# Patient Record
Sex: Female | Born: 1985 | Race: White | Hispanic: No | Marital: Married | Smoking: Never smoker
Health system: Southern US, Community
[De-identification: ages and names within clinical notes are randomized; demographics above are authoritative.]

## PROBLEM LIST (undated history)

## (undated) ENCOUNTER — Inpatient Hospital Stay (HOSPITAL_COMMUNITY): Payer: Self-pay

## (undated) DIAGNOSIS — Z973 Presence of spectacles and contact lenses: Secondary | ICD-10-CM

## (undated) DIAGNOSIS — G43909 Migraine, unspecified, not intractable, without status migrainosus: Secondary | ICD-10-CM

## (undated) DIAGNOSIS — K219 Gastro-esophageal reflux disease without esophagitis: Secondary | ICD-10-CM

## (undated) DIAGNOSIS — J4520 Mild intermittent asthma, uncomplicated: Secondary | ICD-10-CM

## (undated) HISTORY — PX: WISDOM TOOTH EXTRACTION: SHX21

---

## 2005-12-16 HISTORY — PX: TONSILLECTOMY: SUR1361

## 2008-05-06 ENCOUNTER — Emergency Department (HOSPITAL_COMMUNITY): Admission: EM | Admit: 2008-05-06 | Discharge: 2008-05-06 | Payer: Self-pay | Admitting: Emergency Medicine

## 2009-10-26 ENCOUNTER — Ambulatory Visit (HOSPITAL_COMMUNITY): Admission: RE | Admit: 2009-10-26 | Discharge: 2009-10-26 | Payer: Self-pay | Admitting: Gastroenterology

## 2009-10-26 IMAGING — US US ABDOMEN COMPLETE
1 series · 14 of 25 positions shown · non-contrast
Comparison: None.

CLINICAL DATA: Abdominal pain, nausea

ABDOMINAL ULTRASOUND COMPLETE

[Series 1: us abdomen complete · 0.22mm/px · 14 of 61 slices shown]
[im 1/61]
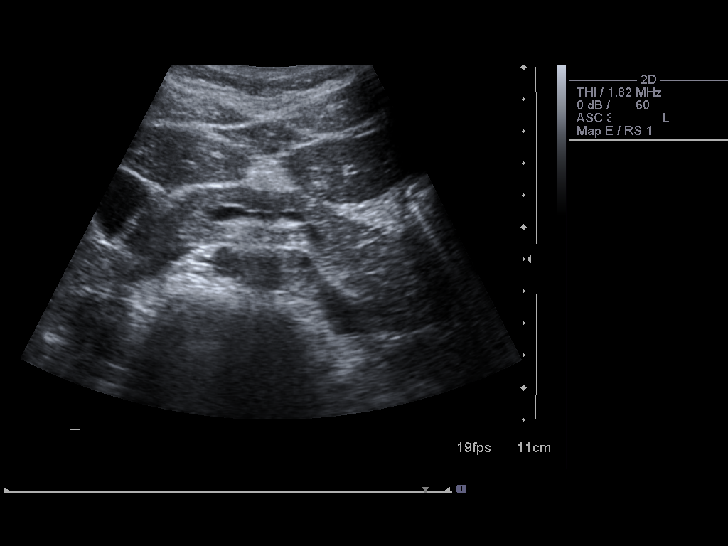
[im 6/61]
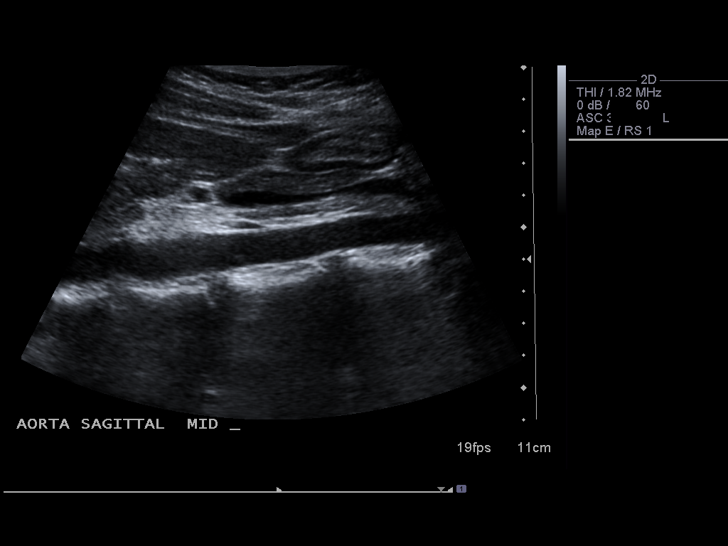
[im 11/61]
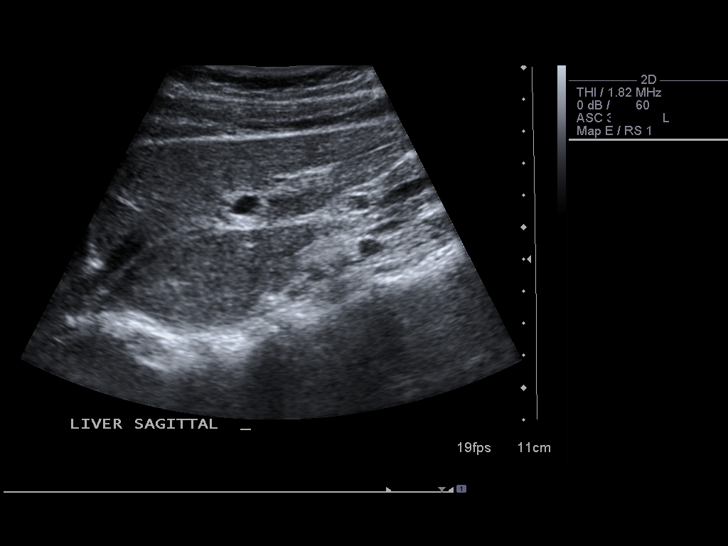
[im 16/61]
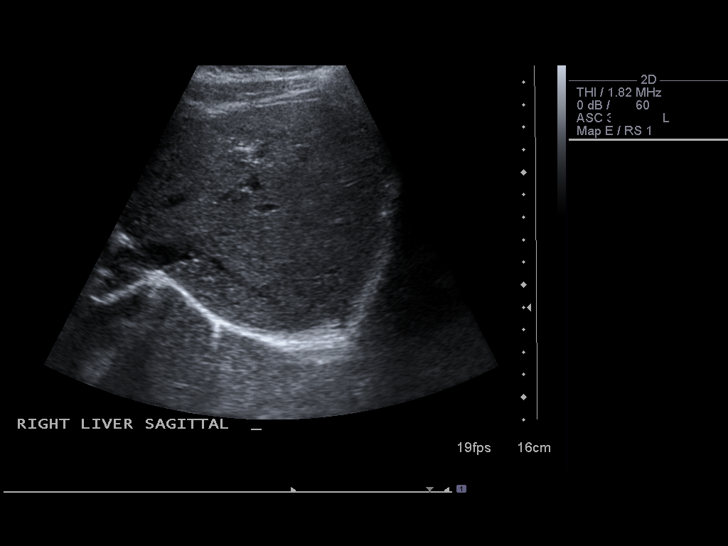
[im 21/61]
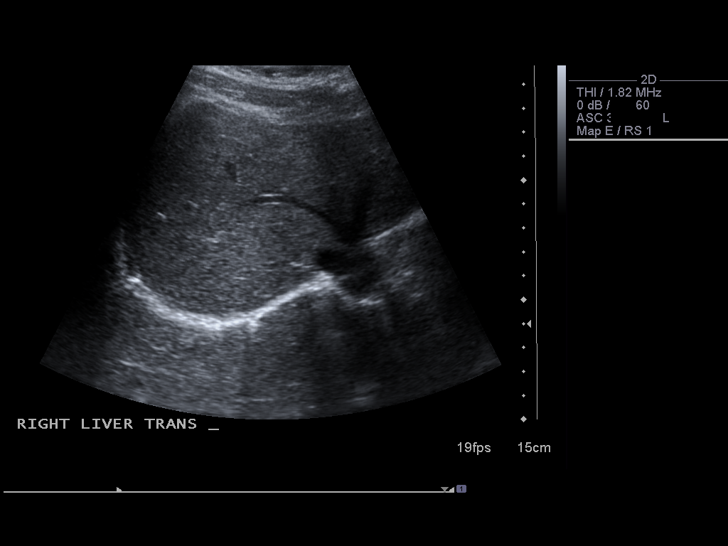
[im 23/61]
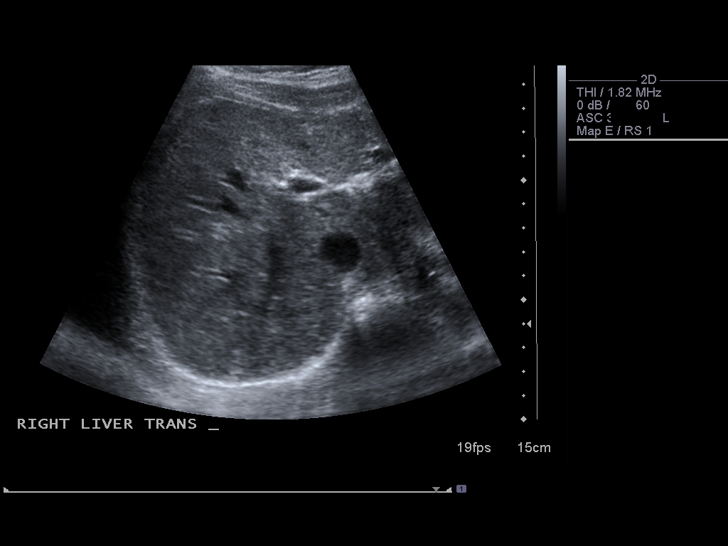
[im 28/61]
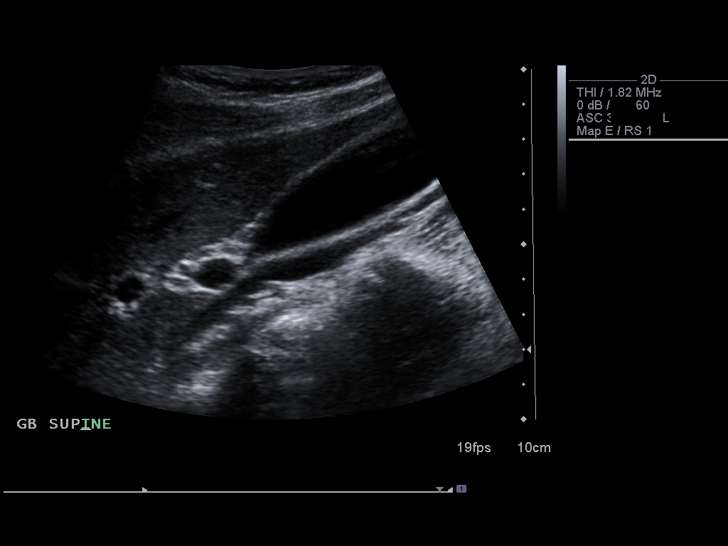
[im 33/61]
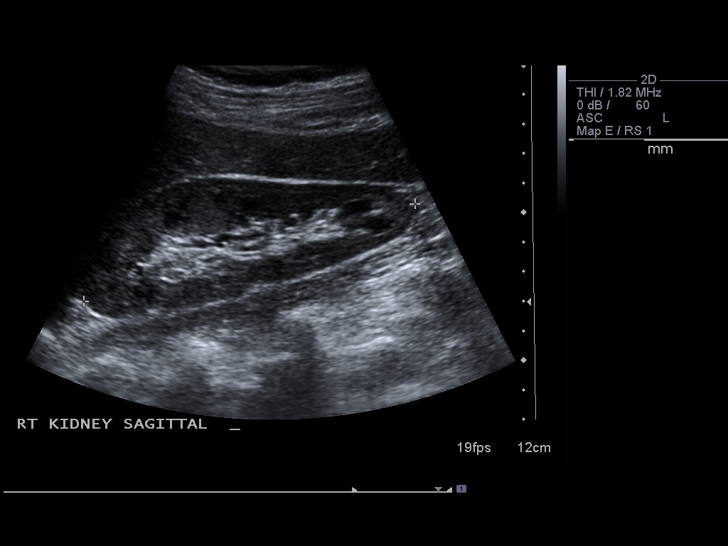
[im 38/61]
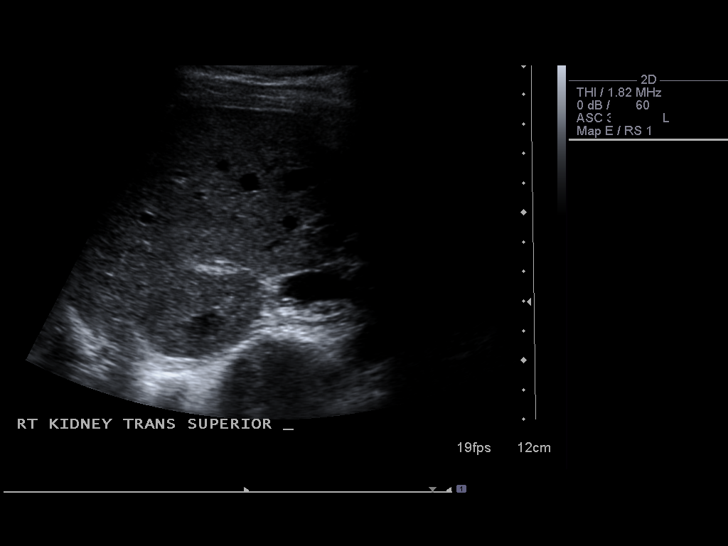
[im 41/61]
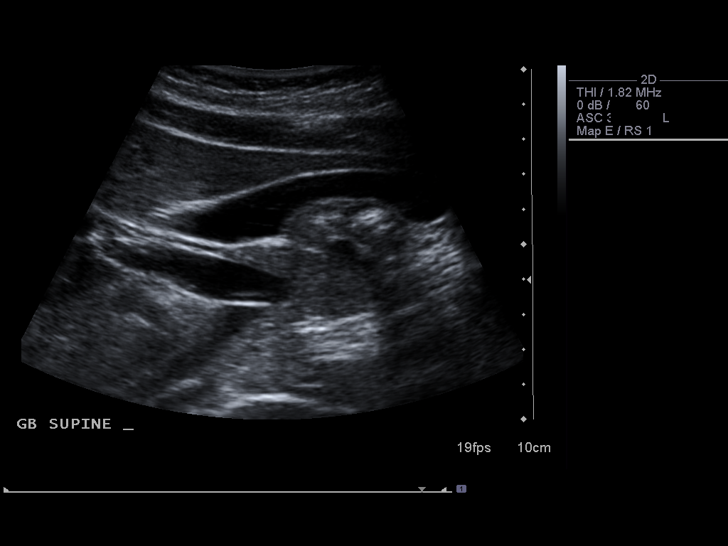
[im 46/61]
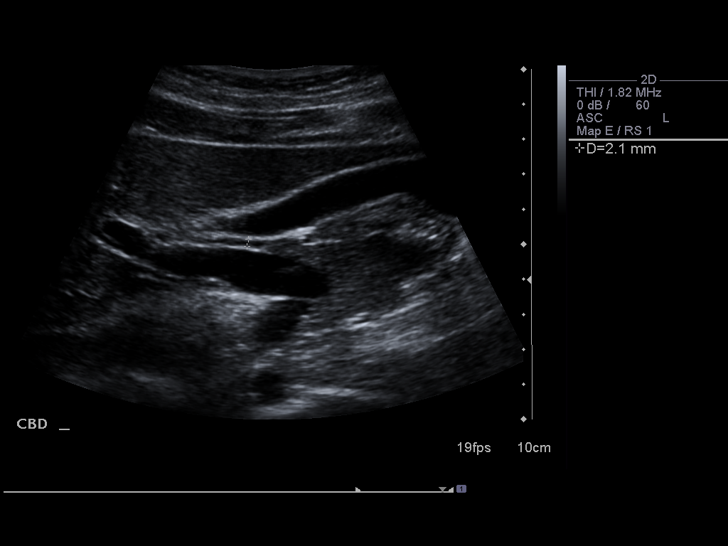
[im 51/61]
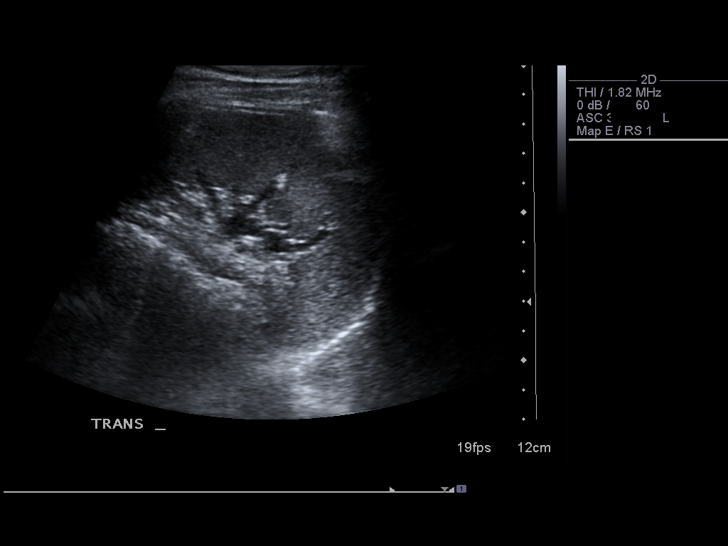
[im 56/61]
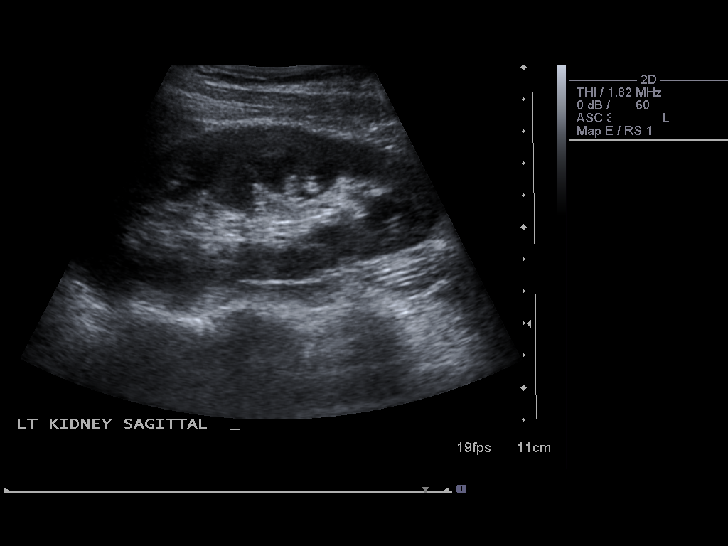
[im 61/61]
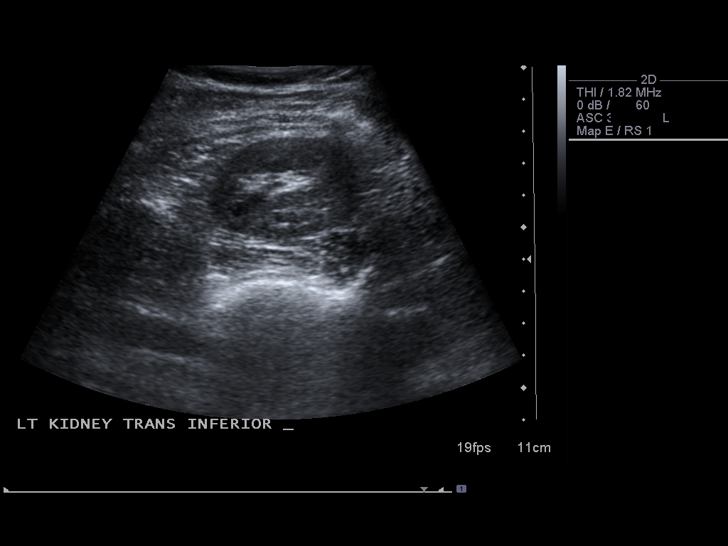

[14 of 25 positions shown; findings below may reference images not displayed]

FINDINGS: Gallbladder:  No gallstones, gallbladder wall thickening, or
pericholecystic fluid.

Common Bile Duct:  Within normal limits in caliber.

Liver:  No focal lesion identified.  Within normal limits in
parenchymal echogenicity.

IVC:  Appears normal.

Pancreas:  Although the pancreas is difficult to visualize in its
entirety, no focal pancreatic abnormality is identified.

Spleen:  Within normal limits in size and echotexture.

Right kidney:  Normal in size and parenchymal echogenicity.  No
evidence of mass or hydronephrosis.

Left kidney:  Normal in size and parenchymal echogenicity.  No
evidence of mass or hydronephrosis.

Abdominal Aorta:  No aneurysm identified.
IMPRESSION: Negative abdominal ultrasound.

## 2009-10-26 IMAGING — NM NM HEPATO W/GB/PHARM/[PERSON_NAME]
2 series · 12 of 12 positions shown · non-contrast
Comparison: Ultrasound 10/26/2009

CLINICAL DATA: Abdominal pain and nausea

NUCLEAR MEDICINE HEPATOBILIARY IMAGING WITH GALLBLADDER EF
TECHNIQUE: Sequential images of the abdomen were obtained [DATE]
minutes following intravenous administration of
radiopharmaceutical. After slow intravenous infusion of 1.05 uCg
Cholecystokinin, gallbladder ejection fraction was determined.
Radiopharmaceutical: 4.8 mCi Dc-CCm Choletec

[he hepatobiliary · 3.43mm/px · 6 of 30 frames shown (1 of 2)]
[frame 3/30]
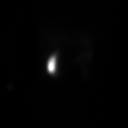
[frame 8/30]
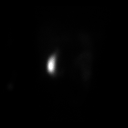
[frame 13/30]
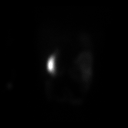
[frame 18/30]
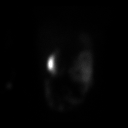
[frame 23/30]
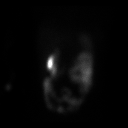
[frame 28/30]
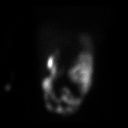

[he hepatobiliary · 3.43mm/px · 6 of 60 frames shown (2 of 2)]
[frame 6/60]
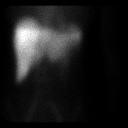
[frame 16/60]
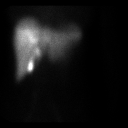
[frame 26/60]
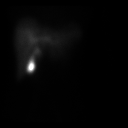
[frame 36/60]
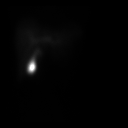
[frame 46/60]
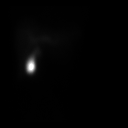
[frame 56/60]
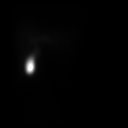

[12 of 12 positions shown; findings below may reference images not displayed]

FINDINGS: There is prompt visualization of hepatic activity. There
is prompt visualization of the common bile duct. Subsequently
intestinal activity was identified.

The gallbladder began to visualize at 13 minutes.

During CCK infusion the gallbladder contracted 80%. 30% or greater
is normal range.

During CCK infusion the patient reported experiencing no symptoms.
IMPRESSION: There is demonstration of patency of the common bile duct and the
cystic duct. There is no evidence of cholecystitis.

During CCK infusion the gallbladder contracted 80%. 30% or greater
is normal range.

During CCK infusion the patient reported experiencing no symptoms.

## 2010-12-13 ENCOUNTER — Encounter
Admission: RE | Admit: 2010-12-13 | Discharge: 2010-12-13 | Payer: Self-pay | Source: Home / Self Care | Attending: General Practice | Admitting: General Practice

## 2010-12-13 IMAGING — CT CT HEAD W/O CM
2 series · 16 of 30 positions shown, 20 images · non-contrast
Comparison: None.

CLINICAL DATA: Increasing migraine headaches for 1 week with
dizziness.

CT HEAD WITHOUT CONTRAST
TECHNIQUE: Contiguous axial images were obtained from the base of
the skull through the vertex without contrast.

[Series 2: head w/o · axial · non-contrast · 0.49mm/px · z∈[+56,+179]mm · 13 of 28 slices shown, 17 images]
[im 2/28  brain]
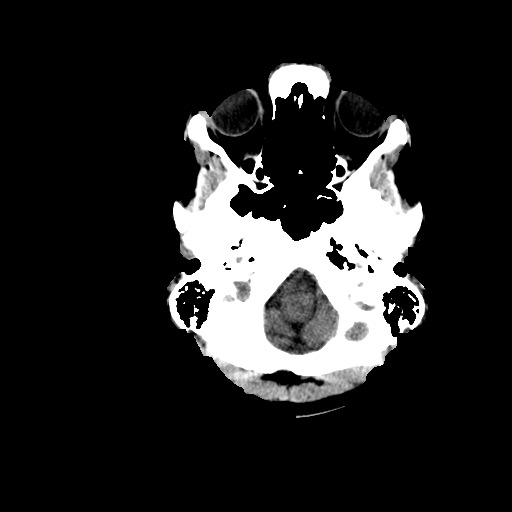
[im 2/28  bone]
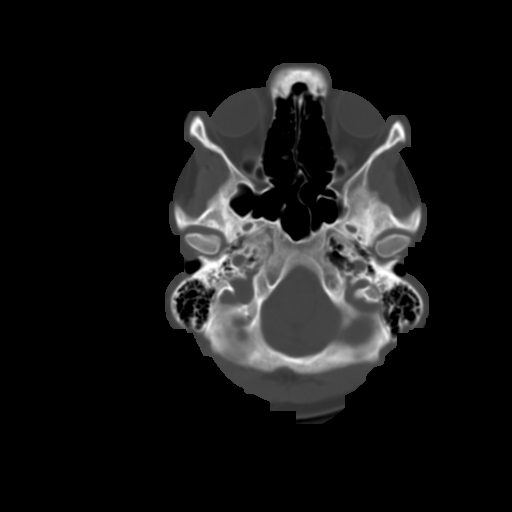
[im 4/28  brain]
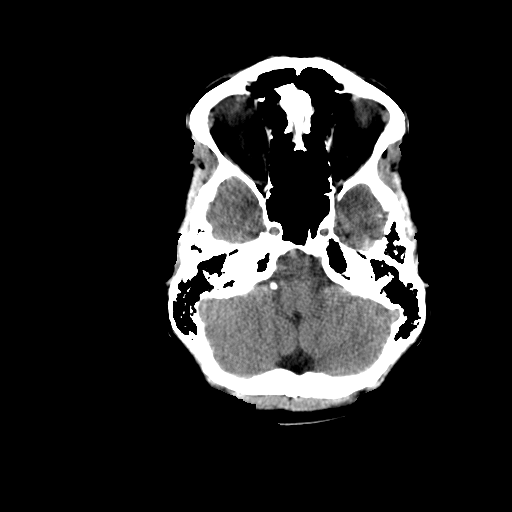
[im 6/28  brain]
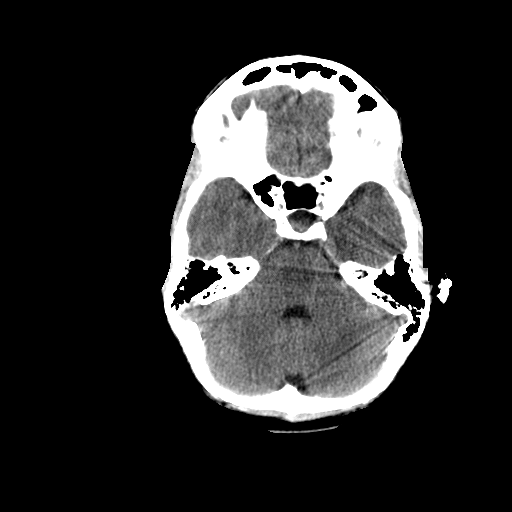
[im 8/28  brain]
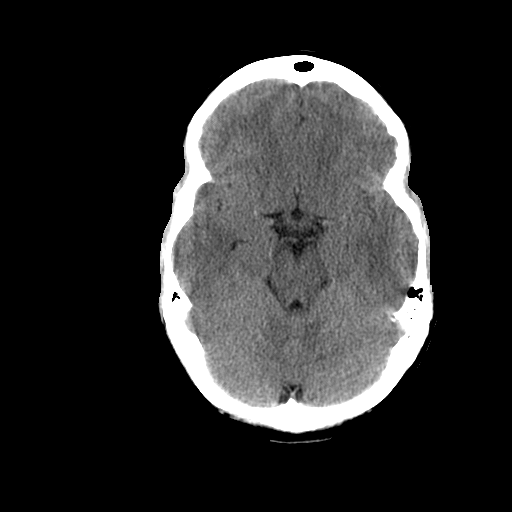
[im 10/28  brain]
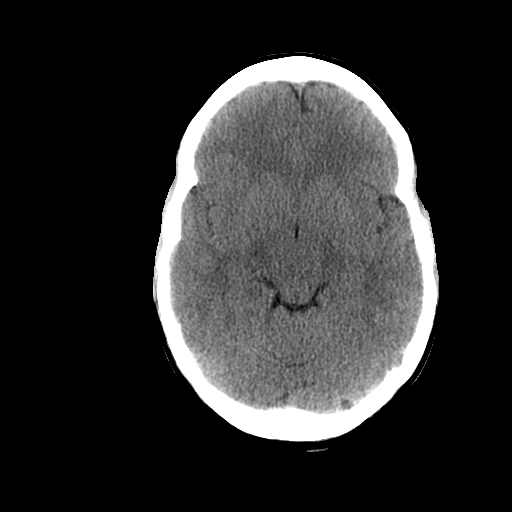
[im 10/28  bone]
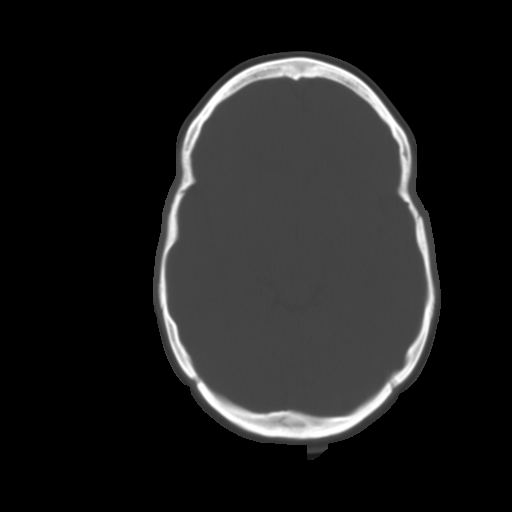
[im 12/28  brain]
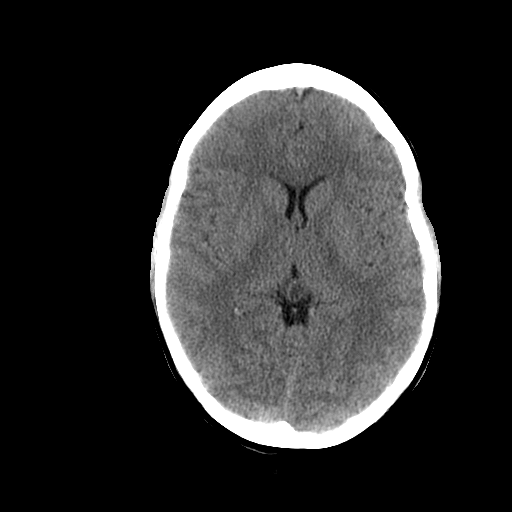
[im 14/28  brain]
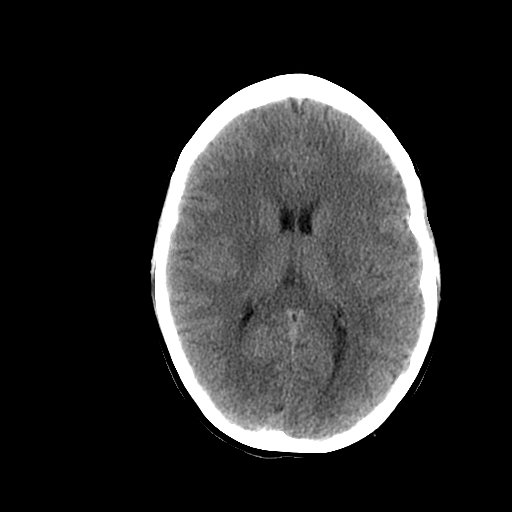
[im 16/28  brain]
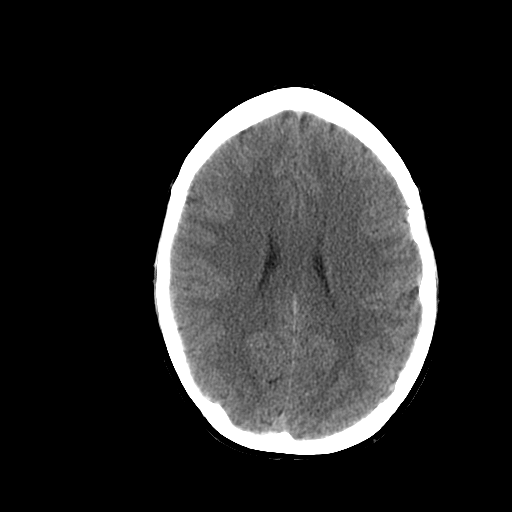
[im 18/28  brain]
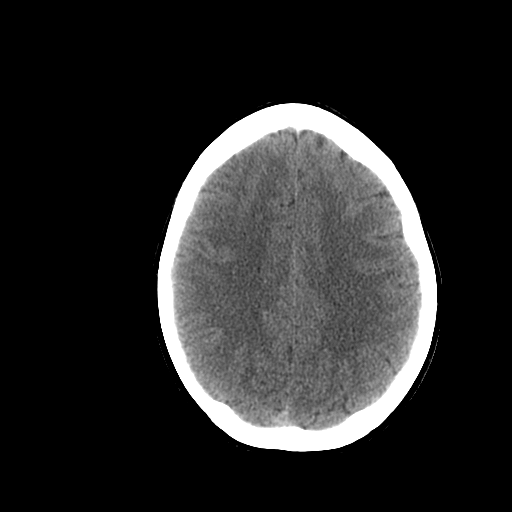
[im 18/28  bone]
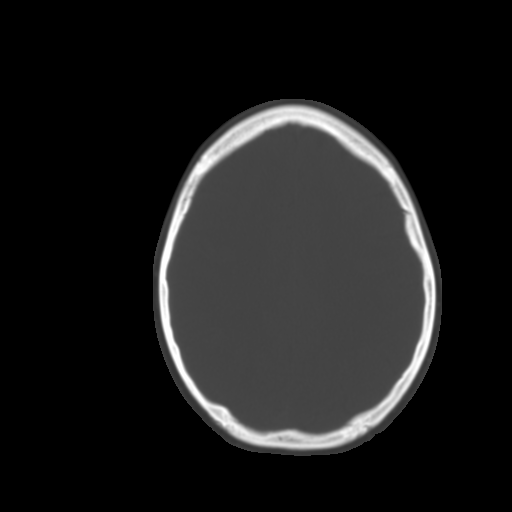
[im 20/28  brain]
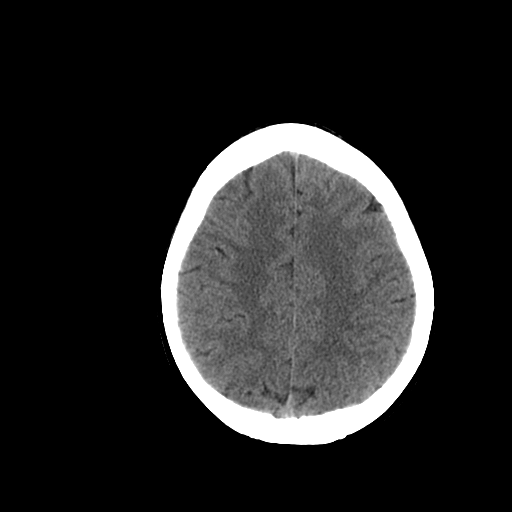
[im 22/28  brain]
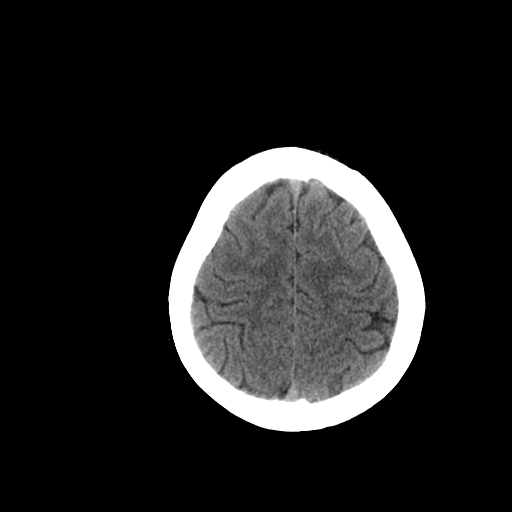
[im 24/28  brain]
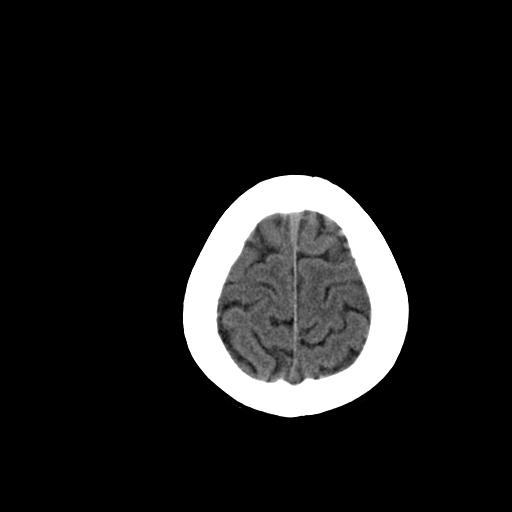
[im 26/28  brain]
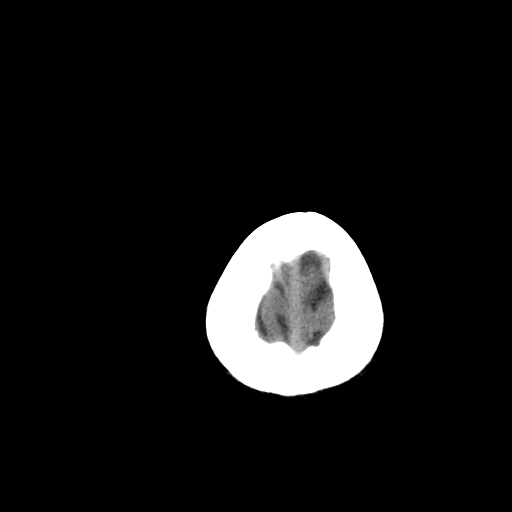
[im 26/28  bone]
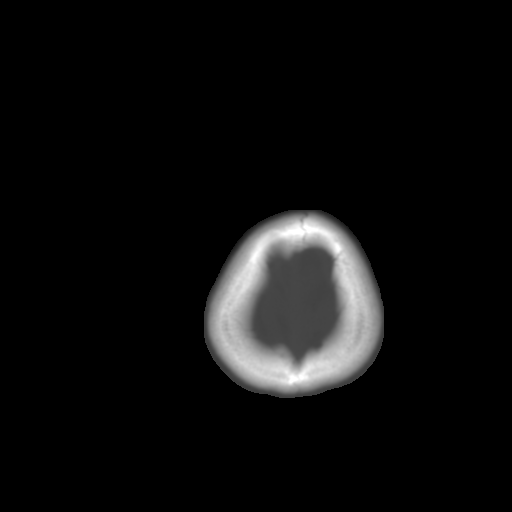

[Series 3: head bone · axial · 0.49mm/px · z∈[+56,+97]mm · 3 of 28 slices shown]
[im 2/28  bone]
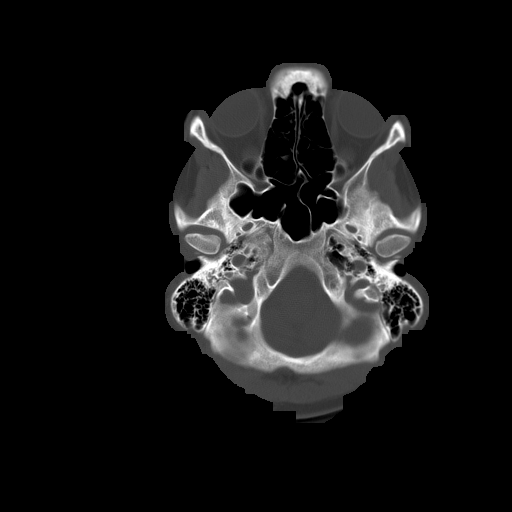
[im 6/28  bone]
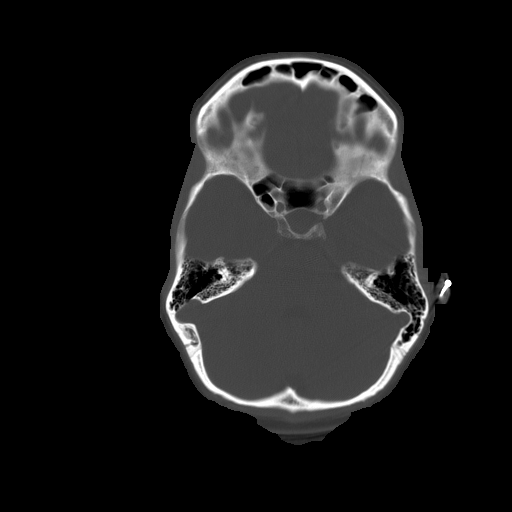
[im 10/28  bone]
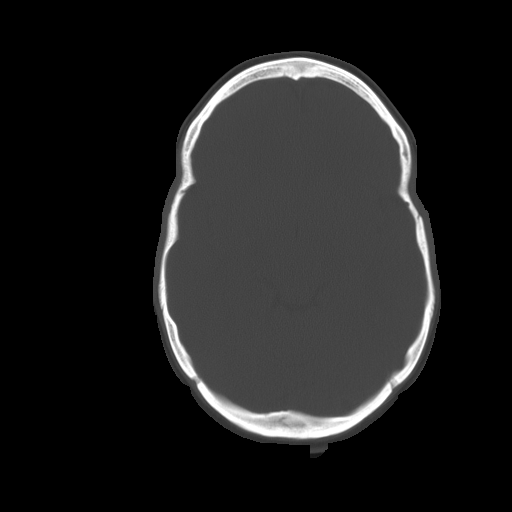

[16 of 30 positions shown; findings below may reference images not displayed]

FINDINGS: There is no evidence of acute intracranial hemorrhage,
mass lesion, brain edema or extra-axial fluid collection.  The
ventricles and subarachnoid spaces are appropriately sized for age.
There is no CT evidence of acute cortical infarction.

The visualized paranasal sinuses are clear. The calvarium is
intact.
IMPRESSION: Unremarkable noncontrast head CT.

## 2011-09-09 ENCOUNTER — Inpatient Hospital Stay (INDEPENDENT_AMBULATORY_CARE_PROVIDER_SITE_OTHER)
Admission: RE | Admit: 2011-09-09 | Discharge: 2011-09-09 | Disposition: A | Discharge status: Home / Self Care | Payer: BC Managed Care – PPO | Source: Ambulatory Visit | Attending: Family Medicine | Admitting: Family Medicine

## 2011-09-09 ENCOUNTER — Encounter: Payer: Self-pay | Admitting: Family Medicine

## 2011-09-09 DIAGNOSIS — J45909 Unspecified asthma, uncomplicated: Secondary | ICD-10-CM

## 2011-09-09 DIAGNOSIS — J301 Allergic rhinitis due to pollen: Secondary | ICD-10-CM

## 2011-09-09 DIAGNOSIS — J069 Acute upper respiratory infection, unspecified: Secondary | ICD-10-CM

## 2011-09-09 DIAGNOSIS — M940 Chondrocostal junction syndrome [Tietze]: Secondary | ICD-10-CM

## 2011-09-11 ENCOUNTER — Telehealth (INDEPENDENT_AMBULATORY_CARE_PROVIDER_SITE_OTHER): Payer: Self-pay | Admitting: Emergency Medicine

## 2011-10-23 ENCOUNTER — Emergency Department
Admission: EM | Admit: 2011-10-23 | Discharge: 2011-10-23 | Disposition: A | Discharge status: Home / Self Care | Payer: BC Managed Care – PPO | Source: Home / Self Care | Attending: Emergency Medicine | Admitting: Emergency Medicine

## 2011-10-23 DIAGNOSIS — R300 Dysuria: Secondary | ICD-10-CM

## 2011-10-23 MED ORDER — CIPROFLOXACIN HCL 500 MG PO TABS: 500.0000 mg | ORAL_TABLET | Freq: Two times a day (BID) | ORAL | Status: AC

## 2011-10-23 NOTE — ED Provider Notes (Signed)
History     CSN: [id number] Arrival date & time: No admission date for patient encounter.   First MD Initiated Contact with Patient 10/23/11 1809      No chief complaint on file.   (Consider location/radiation/quality/duration/timing/severity/associated sxs/prior treatment) HPI This patient complains of UTI symptoms for a few days.  They describe the pain as burning.  They have not used any OTC meds.  She has had a UTI in the past and it felt just like this. + frequency No urgency No hematuria No vaginal discharge No fever/chills No lower abdominal pain No back pain (more than baseline) No fatigue    No past medical history on file.  No past surgical history on file.  No family history on file.  History  Substance Use Topics  . Smoking status: Not on file  . Smokeless tobacco: Not on file  . Alcohol Use: Not on file    OB History    No data available      Review of Systems  Allergies  Review of patient's allergies indicates not on file.  Home Medications  No current outpatient prescriptions on file.  There were no vitals taken for this visit.  Physical Exam  Nursing note and vitals reviewed. Constitutional: She is oriented to person, place, and time. She appears well-developed and well-nourished.  HENT:  Head: Normocephalic and atraumatic.  Neck: Neck supple.  Cardiovascular: Regular rhythm and normal heart sounds.   Pulmonary/Chest: Effort normal and breath sounds normal. No respiratory distress.  Abdominal: Soft. Normal appearance and bowel sounds are normal. She exhibits no mass. There is tenderness in the suprapubic area. There is no rebound, no guarding, no CVA tenderness, no tenderness at McBurney's point and negative Murphy's sign.  Neurological: She is alert and oriented to person, place, and time.  Skin: Skin is warm and dry.  Psychiatric: She has a normal mood and affect. Her speech is normal.    ED Course  Procedures (including critical  care time)  Labs Reviewed - No data to display No results found.   No diagnosis found.    MDM        Lily Kocher, MD 10/23/11 [phone]

## 2011-10-24 LAB — POCT URINALYSIS DIPSTICK

## 2011-10-24 LAB — URINE CULTURE
Colony Count: NO GROWTH
Organism ID, Bacteria: NO GROWTH

## 2011-10-25 ENCOUNTER — Telehealth: Payer: Self-pay | Admitting: *Deleted

## 2011-11-18 NOTE — Progress Notes (Signed)
Summary: SINUSES (rm 4)   Vital Signs:  Patient Profile:   25 Years Old Female CC:      sinus pain, congestion x 3 days Height:     66.5 inches Weight:      137 pounds O2 Sat:      99 % O2 treatment:    Room Air Temp:     98.4 degrees F oral Pulse rate:   82 / minute Resp:     16 per minute BP sitting:   104 / 71  (left arm) Cuff size:   regular  Vitals Entered By: Lajean Saver RN (September 09, 2011 11:29 AM)                  Updated Prior Medication List: NUVARING 0.12-0.015 MG/24HR RING (ETONOGESTREL-ETHINYL ESTRADIOL)  ALBUTEROL SULFATE (2.5 MG/3ML) 0.083% NEBU (ALBUTEROL SULFATE) prn OMEPRAZOLE 20 MG CPDR (OMEPRAZOLE)  EMETROL 1.87-1.87-21.5 SOLN (FRUCTOSE-DEXTROSE-PHOSPHOR ACD)   Current Allergies: * ENVIRONMENTALHistory of Present Illness Chief Complaint: sinus pain, congestion x 3 days History of Present Illness:  Subjective: Patient complains of 3 day history of sinus congestion followed by mild sore throat. She has a history of frequent sinusitis.  She also has a history of mild asthma and occasionally uses an albuterol inhaler + mild cough today. No pleuritic pain No wheezing + post-nasal drainage + sinus pain/pressure No itchy/red eyes ? earache No hemoptysis No SOB No fever/chills No nausea No vomiting No abdominal pain No diarrhea No skin rashes + fatigue +  myalgias No headache Used OTC meds without relief   REVIEW OF SYSTEMS Constitutional Symptoms      Denies fever, chills, night sweats, weight loss, weight gain, and fatigue.  Eyes       Denies change in vision, eye pain, eye discharge, glasses, contact lenses, and eye surgery. Ear/Nose/Throat/Mouth       Complains of frequent runny nose and sinus problems.      Denies hearing loss/aids, change in hearing, ear pain, ear discharge, dizziness, frequent nose bleeds, sore throat, hoarseness, and tooth pain or bleeding.      Comments: congsetion Respiratory       Denies dry cough,  productive cough, wheezing, shortness of breath, asthma, bronchitis, and emphysema/COPD.  Cardiovascular       Denies murmurs, chest pain, and tires easily with exhertion.    Gastrointestinal       Denies stomach pain, nausea/vomiting, diarrhea, constipation, blood in bowel movements, and indigestion. Genitourniary       Denies painful urination, blood or discharge from vagina, kidney stones, and loss of urinary control. Neurological       Complains of headaches.      Denies paralysis, seizures, and fainting/blackouts. Musculoskeletal       Denies muscle pain, joint pain, joint stiffness, decreased range of motion, redness, swelling, muscle weakness, and gout.  Skin       Denies bruising, unusual mles/lumps or sores, and hair/skin or nail changes.  Psych       Denies mood changes, temper/anger issues, anxiety/stress, speech problems, depression, and sleep problems. Other Comments: taken sudafed OTC   Past History:  Past Medical History: migraines reflux asthma  Past Surgical History: Tonsillectomy  Social History: Never Smoked Alcohol use-no Drug use-no Smoking Status:  never Drug Use:  no   Objective:  Appearance:  Patient appears healthy, stated age, and in no acute distress  Eyes:  Pupils are equal, round, and reactive to light and accomodation.  Extraocular movement is intact.  Conjunctivae are not inflamed.  Ears:  Canals normal.  Tympanic membranes normal.   Nose:  Mildly congested turbinates.  No sinus tenderness  Pharynx:  Mildly erythematous Neck:  Supple.  Slightly tender shotty posterior nodes are palpated bilaterally.  Lungs:  Clear to auscultation.  Breath sounds are equal.  Chest:  Distinct tenderness over sternum Heart:  Regular rate and rhythm without murmurs, rubs, or gallops.  Abdomen:  Nontender without masses or hepatosplenomegaly.  Bowel sounds are present.  No CVA or flank tenderness.  Skin:  No rash Assessment New Problems: HAY FEVER  (ICD-477.0) ASTHMA, MILD (ICD-493.90) COSTOCHONDRITIS, ACUTE (ICD-733.6) UPPER RESPIRATORY INFECTION, ACUTE (ICD-465.9)   Plan New Medications/Changes: BENZONATATE 200 MG CAPS (BENZONATATE) One by mouth hs as needed cough  #12 x 0, 09/09/2011, Donna Christen MD AMOXICILLIN 875 MG TABS (AMOXICILLIN) One by mouth two times a day  #20 x 0, 09/09/2011, Donna Christen MD  New Orders: Pulse Oximetry (single measurment) [21308] New Patient Level IV [65784] Planning Comments:   With a history of sinusitis, will begin amoxicillin, expectorant/decongestant, topical decongestant,  cough suppressant at bedtime.  Continue albuterol inhaler.  May take ibuprofen for chest discomfort.  Increase fluid intake Followup with PCP if not improving 7 to 10 days   The patient and/or caregiver has been counseled thoroughly with regard to medications prescribed including dosage, schedule, interactions, rationale for use, and possible side effects and they verbalize understanding.  Diagnoses and expected course of recovery discussed and will return if not improved as expected or if the condition worsens. Patient and/or caregiver verbalized understanding.  Prescriptions: BENZONATATE 200 MG CAPS (BENZONATATE) One by mouth hs as needed cough  #12 x 0   Entered and Authorized by:   Donna Christen MD   Signed by:   Donna Christen MD on 09/09/2011   Method used:   Print then Give to Patient   RxID:   [id number] AMOXICILLIN 875 MG TABS (AMOXICILLIN) One by mouth two times a day  #20 x 0   Entered and Authorized by:   Donna Christen MD   Signed by:   Donna Christen MD on 09/09/2011   Method used:   Print then Give to Patient   RxID:   [id number]   Patient Instructions: 1)  Take Mucinex D (guaifenesin with decongestant) twice daily for congestion. 2)  Increase fluid intake, rest. 3)  May use Afrin nasal spray (or generic oxymetazoline) twice daily for about 5 days.  Also recommend using saline nasal spray  several times daily and/or saline nasal irrigation. 4)  Continue albuterol inhaler. 5)  May take Ibuprofen 200mg , 4 tabs every 8 hours with food if necessary for sternum pain 6)  Followup with family doctor if not improving 7 to 10 days.   Orders Added: 1)  Pulse Oximetry (single measurment) [94760] 2)  New Patient Level IV [59563]

## 2011-11-18 NOTE — Telephone Encounter (Signed)
  Phone Note Outgoing Call   Call placed by: Lavell Islam RN,  September 11, 2011 2:54 PM Call placed to: Patient Action Taken: Phone Call Completed Summary of Call: Message left on voice mail inquiring about patient's status and encouraging her to call with questions/concerns.

## 2012-07-02 ENCOUNTER — Emergency Department (INDEPENDENT_AMBULATORY_CARE_PROVIDER_SITE_OTHER): Payer: BC Managed Care – PPO

## 2012-07-02 ENCOUNTER — Emergency Department
Admission: EM | Admit: 2012-07-02 | Discharge: 2012-07-02 | Disposition: A | Discharge status: Home Health | Payer: BC Managed Care – PPO | Source: Home / Self Care

## 2012-07-02 DIAGNOSIS — J45909 Unspecified asthma, uncomplicated: Secondary | ICD-10-CM

## 2012-07-02 DIAGNOSIS — J45901 Unspecified asthma with (acute) exacerbation: Secondary | ICD-10-CM

## 2012-07-02 DIAGNOSIS — J069 Acute upper respiratory infection, unspecified: Secondary | ICD-10-CM

## 2012-07-02 DIAGNOSIS — R05 Cough: Secondary | ICD-10-CM

## 2012-07-02 IMAGING — CR DG CHEST 2V
2 series · 2 of 2 positions shown · non-contrast
Comparison: None.

CLINICAL DATA: Cough.  Asthma.

CHEST - 2 VIEW

[view not recorded (1 of 2)]
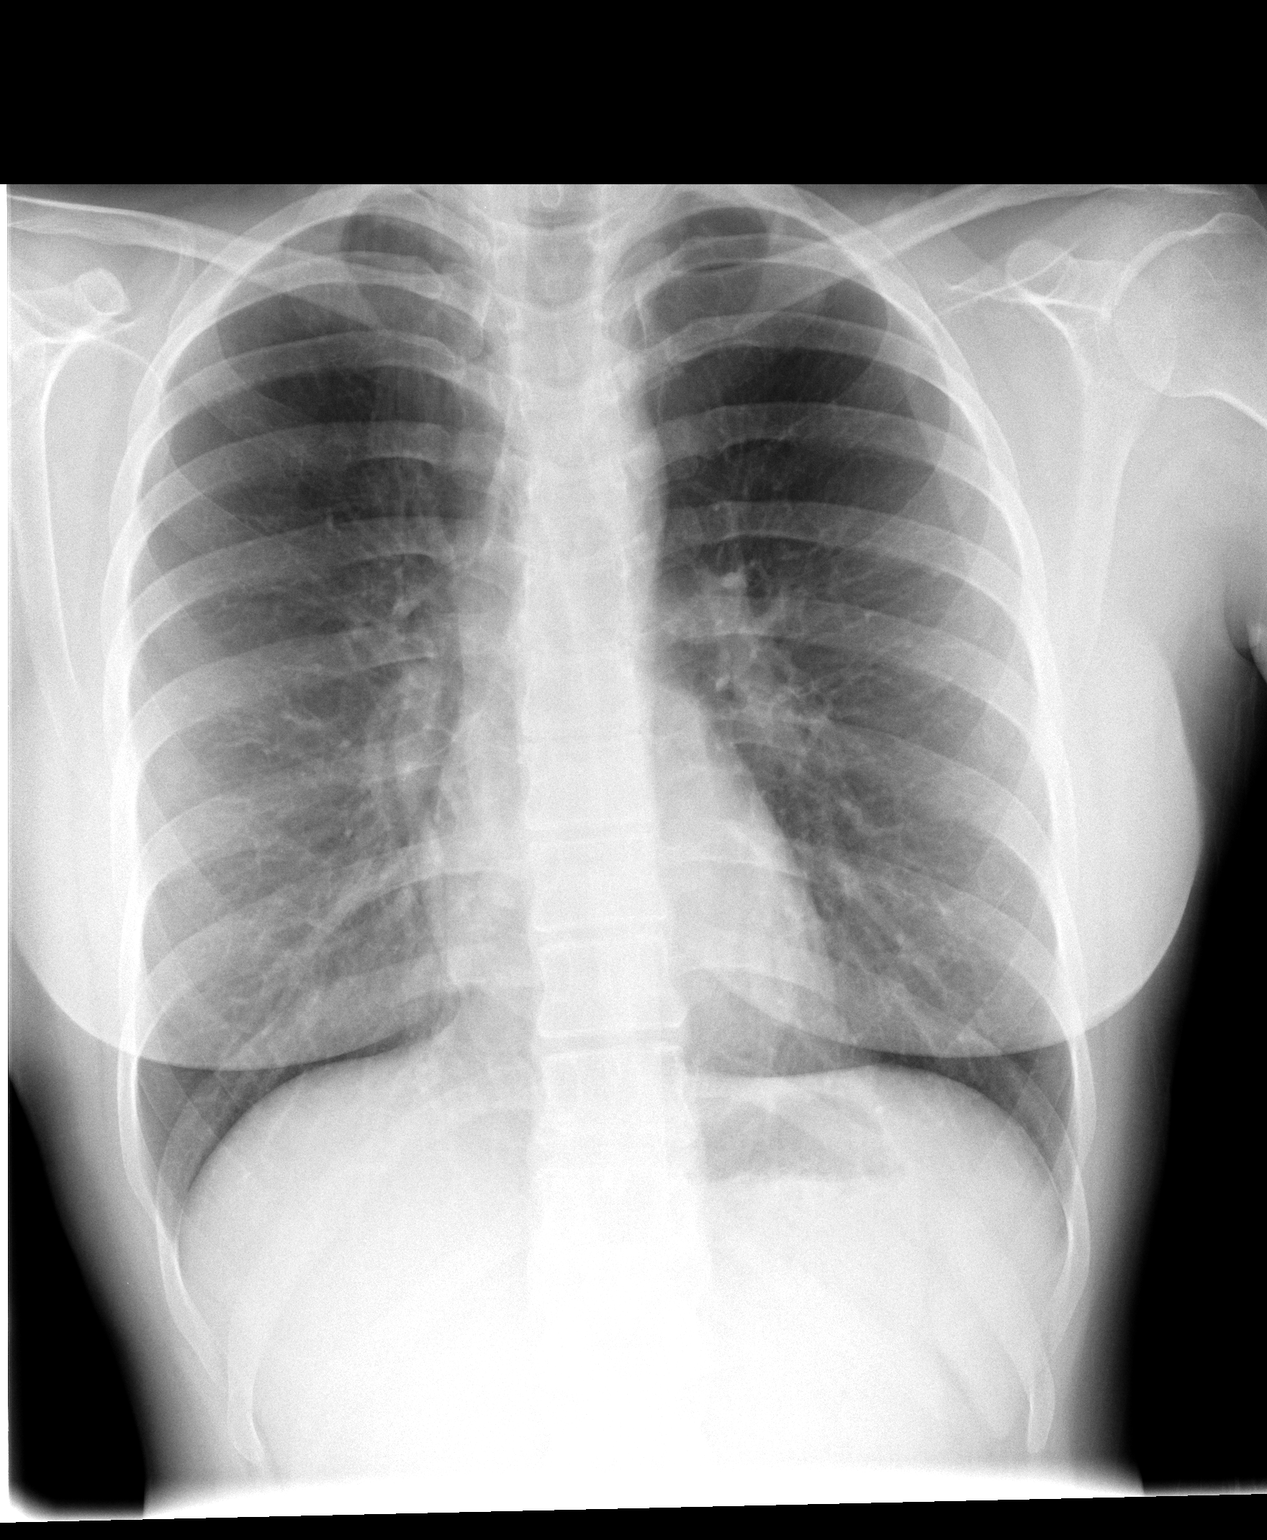

[view not recorded (2 of 2)]
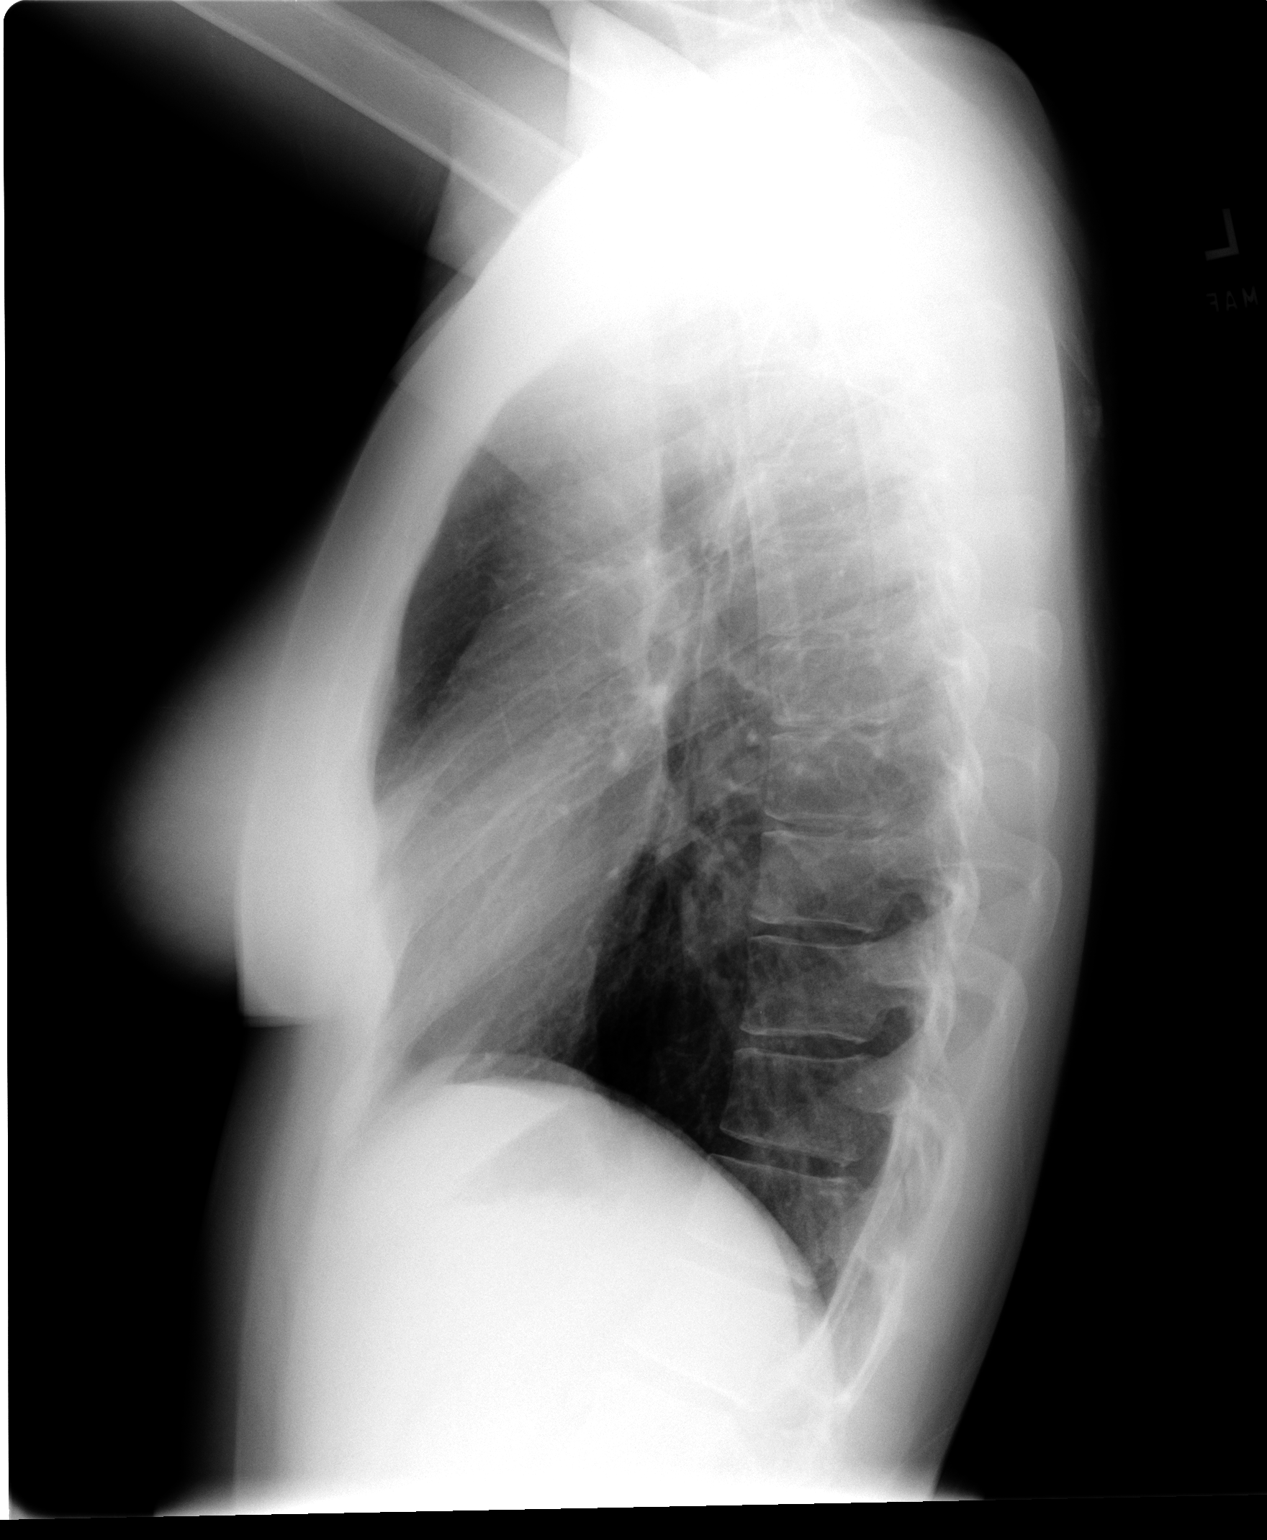

[2 of 2 positions shown; findings below may reference images not displayed]

FINDINGS: The heart size is normal.  The lungs are clear.  The
visualized soft tissues and bony thorax are unremarkable.
IMPRESSION: Negative chest.

## 2012-07-02 MED ORDER — AZITHROMYCIN 250 MG PO TABS: ORAL_TABLET | ORAL | Status: AC

## 2012-07-02 MED ORDER — PREDNISONE 50 MG PO TABS: ORAL_TABLET | ORAL | Status: AC

## 2012-07-02 MED ORDER — METHYLPREDNISOLONE SODIUM SUCC 125 MG IJ SOLR
125.0000 mg | Freq: Once | INTRAMUSCULAR | Status: AC
  Administered 2012-07-02: 125 mg via INTRAMUSCULAR

## 2012-07-02 MED ORDER — IPRATROPIUM-ALBUTEROL 0.5-2.5 (3) MG/3ML IN SOLN
3.0000 mL | Freq: Once | RESPIRATORY_TRACT | Status: AC
  Administered 2012-07-02: 3 mL via RESPIRATORY_TRACT

## 2012-07-02 MED ORDER — ALBUTEROL SULFATE HFA 108 (90 BASE) MCG/ACT IN AERS: 2.0000 | INHALATION_SPRAY | RESPIRATORY_TRACT | Status: DC | PRN

## 2012-07-02 NOTE — ED Provider Notes (Signed)
History     CSN: [id number]  Arrival date & time 07/02/12  1753   First MD Initiated Contact with Patient 07/02/12 1807      Chief Complaint  Patient presents with  . Cough   HPI Comments: URI Symptoms Onset: 1 week Description: cough, post nasal drip, wheezing, mild increased WOB Modifying factors:  Prior hx/o asthma. Ran out of albuterol   Symptoms Nasal discharge: mild Fever: no Sore throat: resolved Cough: yes Wheezing: yes Ear pain: no GI symptoms: no Sick contacts: yes  Red Flags  Stiff neck: no Dyspnea: mild Rash: no Swallowing difficulty: no  Sinusitis Risk Factors Headache/face pain: no Double sickening: no tooth pain: no  Allergy Risk Factors Sneezing: no Itchy scratchy throat: no Seasonal symptoms: mild  Flu Risk Factors Headache: no muscle aches: no severe fatigue: no    Patient is a 26 y.o. female presenting with cough.  Cough    Past Medical History  Diagnosis Date  . Asthma   . Acid reflux     Past Surgical History  Procedure Date  . Tonsillectomy   . Wisdom tooth extraction     No family history on file.  History  Substance Use Topics  . Smoking status: Never Smoker   . Smokeless tobacco: Not on file  . Alcohol Use: No    OB History    Grav Para Term Preterm Abortions TAB SAB Ect Mult Living                  Review of Systems  Respiratory: Positive for cough.   All other systems reviewed and are negative.    Allergies  Review of patient's allergies indicates no known allergies.  Home Medications   Current Outpatient Rx  Name Route Sig Dispense Refill  . ALBUTEROL SULFATE HFA 108 (90 BASE) MCG/ACT IN AERS Inhalation Inhale 2 puffs into the lungs every 4 (four) hours as needed for wheezing (cough, shortness of breath or wheezing.). 1 Inhaler 1  . ALBUTEROL SULFATE (2.5 MG/3ML) 0.083% IN NEBU Nebulization Take 2.5 mg by nebulization every 6 (six) hours as needed.      . AZITHROMYCIN 250 MG PO TABS  Take 2  tabs PO x 1 dose, then 1 tab PO QD x 4 days 6 tablet 0  . ETONOGESTREL-ETHINYL ESTRADIOL 0.12-0.015 MG/24HR VA RING Vaginal Place 1 each vaginally every 28 (twenty-eight) days. Insert vaginally and leave in place for 3 consecutive weeks, then remove for 1 week.     Marland Kitchen OMEPRAZOLE 20 MG PO CPDR Oral Take 20 mg by mouth daily.      Marland Kitchen PREDNISONE 50 MG PO TABS  1 tab daily x 4 days starting 07/03/12 4 tablet 0    BP 106/70  Pulse 92  Temp 98 F (36.7 C) (Oral)  Resp 16  Ht 5' 6.5" (1.689 m)  Wt 140 lb (63.504 kg)  BMI 22.26 kg/m2  SpO2 100%  Physical Exam  Constitutional: She appears well-developed and well-nourished.  HENT:  Head: Normocephalic and atraumatic.  Eyes: Conjunctivae are normal. Pupils are equal, round, and reactive to light.  Neck: Normal range of motion. Neck supple.  Cardiovascular: Normal rate and regular rhythm.   Pulmonary/Chest:       Overall poor air movement.   Abdominal: Soft. Bowel sounds are normal.  Musculoskeletal: Normal range of motion.  Neurological: She is alert.  Skin: Skin is warm.    ED Course  Procedures (including critical care time)  Labs Reviewed - No  data to display Dg Chest 2 View  07/02/2012  *RADIOLOGY REPORT*  Clinical Data: Cough.  Asthma.  CHEST - 2 VIEW  Comparison: None.  Findings: The heart size is normal.  The lungs are clear.  The visualized soft tissues and bony thorax are unremarkable.  IMPRESSION: Negative chest.  Original Report Authenticated By: Jamesetta Orleans. MATTERN, M.D.     1. Asthma exacerbation   2. URI (upper respiratory infection)       MDM  Improved aeration s/p duoneb treatment.  Solumedrol 125 mg IM x 1.  CXR negative for infiltrate, though with some prominent interstitial markings.  Will treat with prednisone.  Refilled inhaler. Encouraged PCP set up.  Azithromycin for atypical coverage given length of sxs and resp RFs.  Handout given.  Discussed general respiratory red flags including persistent  increased WOB and wheezing despite treatment.     The patient and/or caregiver has been counseled thoroughly with regard to treatment plan and/or medications prescribed including dosage, schedule, interactions, rationale for use, and possible side effects and they verbalize understanding. Diagnoses and expected course of recovery discussed and will return if not improved as expected or if the condition worsens. Patient and/or caregiver verbalized understanding.              Floydene Flock, MD 07/02/12 2020

## 2012-07-02 NOTE — ED Notes (Signed)
Pt c/o non productive cough onset 2 days ago.  Pt states she has felt intermittently febrile, states congestion seems to be worse in the evenings after she lies down for bed.  Pt states she is out of her Albuterol inhaler.

## 2012-07-04 NOTE — ED Provider Notes (Signed)
Agree with exam, assessment, and plan.   Lattie Haw, MD 07/04/12 [phone]

## 2014-12-16 NOTE — L&D Delivery Note (Signed)
  Delivery Note At 10:32 AM a viable female was delivered via  OA Presentation Apgars 8 9 weight pending Placenta status: manuallyremoved intact with 3 vessel cord, .  Cord:  with the following complications: none.  Cord pH: not obtained Thick uterine septum palpated Anesthesia:  epidural Episiotomy: none  Lacerations:  2nd Suture Repair: 3.0 chromic Est. Blood Loss (mL):  400  Mom to postpartum.  Baby to NICU.  Makynlie Rossini L 06/26/2015, 10:47 AM

## 2015-01-16 LAB — OB RESULTS CONSOLE HEPATITIS B SURFACE ANTIGEN: Hepatitis B Surface Ag: NEGATIVE

## 2015-01-16 LAB — OB RESULTS CONSOLE RPR: RPR: NONREACTIVE

## 2015-01-16 LAB — OB RESULTS CONSOLE GC/CHLAMYDIA
Chlamydia: NEGATIVE
Gonorrhea: NEGATIVE

## 2015-01-16 LAB — OB RESULTS CONSOLE HIV ANTIBODY (ROUTINE TESTING): HIV: NONREACTIVE

## 2015-01-16 LAB — OB RESULTS CONSOLE RUBELLA ANTIBODY, IGM: Rubella: IMMUNE

## 2015-06-01 ENCOUNTER — Inpatient Hospital Stay (HOSPITAL_COMMUNITY)
Admission: AD | Admit: 2015-06-01 | Discharge: 2015-06-28 | DRG: 775 | Disposition: A | Discharge status: Home / Self Care | Payer: 59 | Source: Ambulatory Visit | Attending: Obstetrics and Gynecology | Admitting: Obstetrics and Gynecology

## 2015-06-01 ENCOUNTER — Encounter (HOSPITAL_COMMUNITY): Payer: Self-pay

## 2015-06-01 DIAGNOSIS — O42919 Preterm premature rupture of membranes, unspecified as to length of time between rupture and onset of labor, unspecified trimester: Secondary | ICD-10-CM | POA: Diagnosis present

## 2015-06-01 DIAGNOSIS — Z3A29 29 weeks gestation of pregnancy: Secondary | ICD-10-CM | POA: Insufficient documentation

## 2015-06-01 DIAGNOSIS — Z3A32 32 weeks gestation of pregnancy: Secondary | ICD-10-CM | POA: Diagnosis present

## 2015-06-01 DIAGNOSIS — Q513 Bicornate uterus: Secondary | ICD-10-CM | POA: Insufficient documentation

## 2015-06-01 DIAGNOSIS — Z3490 Encounter for supervision of normal pregnancy, unspecified, unspecified trimester: Secondary | ICD-10-CM

## 2015-06-01 DIAGNOSIS — Q512 Other doubling of uterus: Secondary | ICD-10-CM | POA: Diagnosis not present

## 2015-06-01 DIAGNOSIS — O42112 Preterm premature rupture of membranes, onset of labor more than 24 hours following rupture, second trimester: Secondary | ICD-10-CM | POA: Insufficient documentation

## 2015-06-01 DIAGNOSIS — Z3689 Encounter for other specified antenatal screening: Secondary | ICD-10-CM | POA: Insufficient documentation

## 2015-06-01 DIAGNOSIS — O9952 Diseases of the respiratory system complicating childbirth: Secondary | ICD-10-CM | POA: Diagnosis present

## 2015-06-01 DIAGNOSIS — J45909 Unspecified asthma, uncomplicated: Secondary | ICD-10-CM | POA: Diagnosis present

## 2015-06-01 DIAGNOSIS — Z3A28 28 weeks gestation of pregnancy: Secondary | ICD-10-CM | POA: Insufficient documentation

## 2015-06-01 DIAGNOSIS — O34593 Maternal care for other abnormalities of gravid uterus, third trimester: Secondary | ICD-10-CM | POA: Diagnosis present

## 2015-06-01 DIAGNOSIS — O4290 Premature rupture of membranes, unspecified as to length of time between rupture and onset of labor, unspecified weeks of gestation: Secondary | ICD-10-CM | POA: Insufficient documentation

## 2015-06-01 DIAGNOSIS — O42913 Preterm premature rupture of membranes, unspecified as to length of time between rupture and onset of labor, third trimester: Secondary | ICD-10-CM | POA: Diagnosis present

## 2015-06-01 HISTORY — DX: Migraine, unspecified, not intractable, without status migrainosus: G43.909

## 2015-06-01 LAB — AMNISURE RUPTURE OF MEMBRANE (ROM) NOT AT ARMC: AMNISURE: POSITIVE

## 2015-06-01 MED ORDER — LACTATED RINGERS IV SOLN
INTRAVENOUS | Status: DC
Start: 2015-06-02 — End: 2015-06-09
  Administered 2015-06-02 – 2015-06-05 (×7): via INTRAVENOUS

## 2015-06-01 MED ORDER — ZOLPIDEM TARTRATE 5 MG PO TABS: 5.0000 mg | ORAL_TABLET | Freq: Every evening | ORAL | Status: DC | PRN

## 2015-06-01 MED ORDER — ACETAMINOPHEN 325 MG PO TABS
650.0000 mg | ORAL_TABLET | ORAL | Status: DC | PRN
  Administered 2015-06-11 – 2015-06-25 (×3): 650 mg via ORAL
  Filled 2015-06-01 (×3): qty 2

## 2015-06-01 MED ORDER — CALCIUM CARBONATE ANTACID 500 MG PO CHEW: 2.0000 | CHEWABLE_TABLET | ORAL | Status: DC | PRN

## 2015-06-01 MED ORDER — SODIUM CHLORIDE 0.9 % IV SOLN
3.0000 g | Freq: Four times a day (QID) | INTRAVENOUS | Status: DC
  Administered 2015-06-02 – 2015-06-04 (×12): 3 g via INTRAVENOUS
  Filled 2015-06-01 (×13): qty 3

## 2015-06-01 MED ORDER — DOCUSATE SODIUM 100 MG PO CAPS
100.0000 mg | ORAL_CAPSULE | Freq: Every day | ORAL | Status: DC
  Administered 2015-06-02 – 2015-06-25 (×24): 100 mg via ORAL
  Filled 2015-06-01 (×24): qty 1

## 2015-06-01 MED ORDER — PRENATAL MULTIVITAMIN CH
1.0000 | ORAL_TABLET | Freq: Every day | ORAL | Status: DC
  Administered 2015-06-02 – 2015-06-25 (×24): 1 via ORAL
  Filled 2015-06-01 (×25): qty 1

## 2015-06-01 MED ORDER — AZITHROMYCIN 250 MG PO TABS
500.0000 mg | ORAL_TABLET | Freq: Every day | ORAL | Status: DC
  Administered 2015-06-02 – 2015-06-04 (×3): 500 mg via ORAL
  Filled 2015-06-01 (×3): qty 2

## 2015-06-01 MED ORDER — BETAMETHASONE SOD PHOS & ACET 6 (3-3) MG/ML IJ SUSP
12.0000 mg | INTRAMUSCULAR | Status: AC
  Administered 2015-06-02 – 2015-06-03 (×2): 12 mg via INTRAMUSCULAR
  Filled 2015-06-01 (×2): qty 2

## 2015-06-01 NOTE — MAU Provider Note (Signed)
  History     CSN: [id number]  Arrival date and time: 06/01/15 2153   First Provider Initiated Contact with Patient 06/01/15 2312      No chief complaint on file.  HPI Comments: Anna Brewer is a 29 y.o. G1P0 at [redacted]w[redacted]d who presents today with lower abdominal pain and leaking of fluid. She states that she was seen about 2 days ago in the office, and was dx with UTI. She also had a cervix measured, and it was 1.8cm. She has an appointment on Monday to recheck cervical length. She states that she feels like her discharge has been a lot more more watery today. She denies any vaginal bleeding. She states that the fetus has been active. She rates her current pain 4/10.    Past Medical History  Diagnosis Date  . Asthma   . Acid reflux   . Migraine     Past Surgical History  Procedure Laterality Date  . Tonsillectomy    . Wisdom tooth extraction      History reviewed. No pertinent family history.  History  Substance Use Topics  . Smoking status: Never Smoker   . Smokeless tobacco: Not on file  . Alcohol Use: No    Allergies: No Known Allergies  Prescriptions prior to admission  Medication Sig Dispense Refill Last Dose  . cephALEXin (KEFLEX) 500 MG capsule Take 500 mg by mouth 3 (three) times daily.   06/01/2015 at Unknown time  . Prenatal Vit-Fe Fumarate-FA (PRENATAL MULTIVITAMIN) TABS tablet Take 1 tablet by mouth daily at 12 noon.   06/01/2015 at Unknown time    Review of Systems  Constitutional: Negative for fever.  Gastrointestinal: Positive for abdominal pain. Negative for nausea, vomiting, diarrhea and constipation.  Genitourinary: Negative for dysuria, urgency and frequency.   Physical Exam   Blood pressure 121/79, pulse 91, temperature 98.2 F (36.8 C), temperature source Oral, resp. rate 20, height 5\' 7"  (1.702 m), weight 78.472 kg (173 lb), SpO2 98 %.  Physical Exam  Nursing note and vitals reviewed. Constitutional: She is oriented to person, place, and time.  She appears well-developed and well-nourished. No distress.  HENT:  Head: Normocephalic.  Cardiovascular: Normal rate.   Respiratory: Effort normal.  GI: Soft. There is no tenderness. There is no rebound.  Genitourinary:   External: no lesion Vagina: pooling of clear fluid  Cervix: pink, smooth, difficult to visualize well with fluid  Uterus: AGA  Neurological: She is alert and oriented to person, place, and time.  Skin: Skin is warm and dry.  Psychiatric: She has a normal mood and affect.   FHT: 140, moderate with 15x15 accels, no decels Toco: no UCs   Results for orders placed or performed during the hospital encounter of 06/01/15 (from the past 24 hour(s))  Amnisure rupture of membrane (rom)not at [facility]     Status: None   Collection Time: 06/01/15 11:20 PM  Result Value Ref Range   Amnisure ROM POSITIVE     MAU Course  Procedures  MDM  3154: D/W Dr. Rana Snare, will admit to antenatal for latency antibiotics  Assessment and Plan  PPROM Admit to antenatal for latency antibiotics   Tawnya Crook 06/01/2015, 11:13 PM

## 2015-06-01 NOTE — MAU Note (Signed)
Complains of back and stomach pain.  Was seen in office two days ago, diagnosed bladder infection.  Pt feels she is leaking fluid. Denies NVD

## 2015-06-02 ENCOUNTER — Inpatient Hospital Stay (HOSPITAL_COMMUNITY): Payer: 59

## 2015-06-02 ENCOUNTER — Encounter (HOSPITAL_COMMUNITY)
Admit: 2015-06-02 | Discharge: 2015-06-02 | Disposition: A | Discharge status: Home / Self Care | Payer: 59 | Attending: Obstetrics and Gynecology | Admitting: Obstetrics and Gynecology

## 2015-06-02 ENCOUNTER — Encounter (HOSPITAL_COMMUNITY): Payer: Self-pay | Admitting: *Deleted

## 2015-06-02 DIAGNOSIS — Q513 Bicornate uterus: Secondary | ICD-10-CM | POA: Insufficient documentation

## 2015-06-02 DIAGNOSIS — Z3689 Encounter for other specified antenatal screening: Secondary | ICD-10-CM | POA: Insufficient documentation

## 2015-06-02 DIAGNOSIS — O42919 Preterm premature rupture of membranes, unspecified as to length of time between rupture and onset of labor, unspecified trimester: Secondary | ICD-10-CM

## 2015-06-02 DIAGNOSIS — O42112 Preterm premature rupture of membranes, onset of labor more than 24 hours following rupture, second trimester: Secondary | ICD-10-CM | POA: Insufficient documentation

## 2015-06-02 DIAGNOSIS — O4290 Premature rupture of membranes, unspecified as to length of time between rupture and onset of labor, unspecified weeks of gestation: Secondary | ICD-10-CM | POA: Insufficient documentation

## 2015-06-02 DIAGNOSIS — Z3A28 28 weeks gestation of pregnancy: Secondary | ICD-10-CM | POA: Insufficient documentation

## 2015-06-02 LAB — URINALYSIS, ROUTINE W REFLEX MICROSCOPIC
Bilirubin Urine: NEGATIVE
Glucose, UA: NEGATIVE mg/dL
HGB URINE DIPSTICK: NEGATIVE
KETONES UR: NEGATIVE mg/dL
LEUKOCYTES UA: NEGATIVE
NITRITE: NEGATIVE
PH: 6 (ref 5.0–8.0)
PROTEIN: NEGATIVE mg/dL
SPECIFIC GRAVITY, URINE: 1.025 (ref 1.005–1.030)
UROBILINOGEN UA: 0.2 mg/dL (ref 0.0–1.0)

## 2015-06-02 LAB — CBC
HEMATOCRIT: 38.1 % (ref 36.0–46.0)
Hemoglobin: 12.9 g/dL (ref 12.0–15.0)
MCH: 30.9 pg (ref 26.0–34.0)
MCHC: 33.9 g/dL (ref 30.0–36.0)
MCV: 91.4 fL (ref 78.0–100.0)
PLATELETS: 200 10*3/uL (ref 150–400)
RBC: 4.17 MIL/uL (ref 3.87–5.11)
RDW: 12.5 % (ref 11.5–15.5)
WBC: 17.8 10*3/uL — AB (ref 4.0–10.5)

## 2015-06-02 LAB — WET PREP, GENITAL
Clue Cells Wet Prep HPF POC: NONE SEEN
Trich, Wet Prep: NONE SEEN
YEAST WET PREP: NONE SEEN

## 2015-06-02 LAB — TYPE AND SCREEN
ABO/RH(D): O POS
Antibody Screen: NEGATIVE

## 2015-06-02 LAB — OB RESULTS CONSOLE GBS: GBS: NEGATIVE

## 2015-06-02 LAB — ABO/RH: ABO/RH(D): O POS

## 2015-06-02 IMAGING — US US OB LIMITED
1 series · 14 of 21 positions shown · non-contrast
Comparison: none

[Series 1: us ob follow up · 21 acquisitions, 14 frames shown]
[im 1/21]
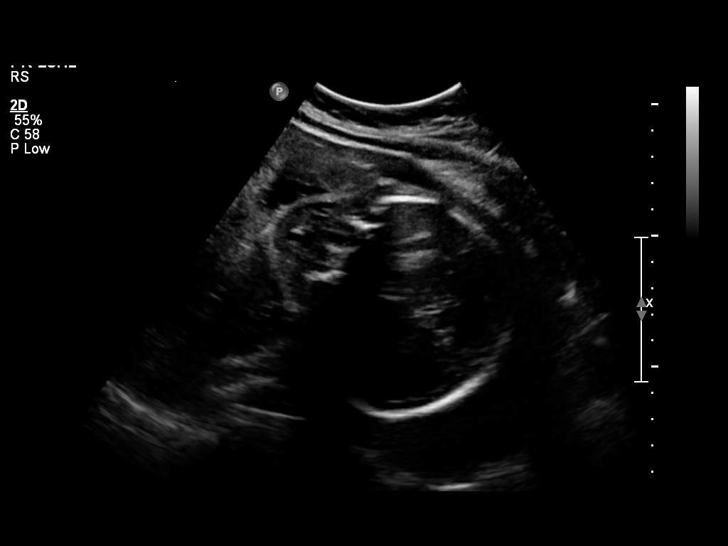
[im 3/21]
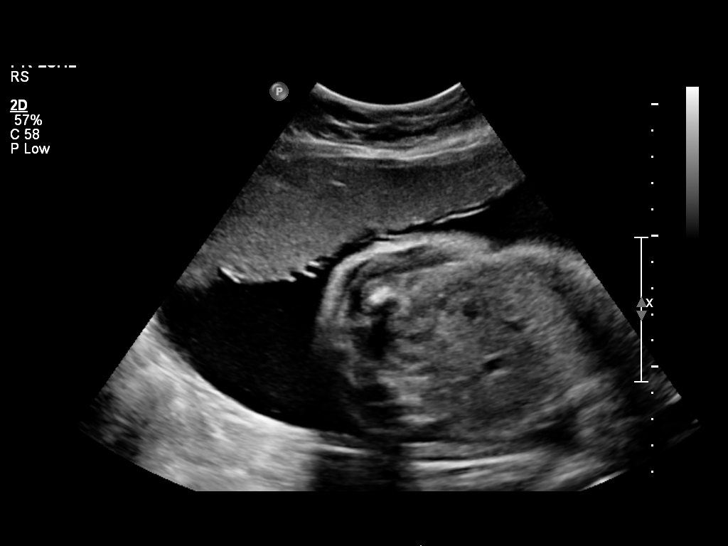
[im 4/21]
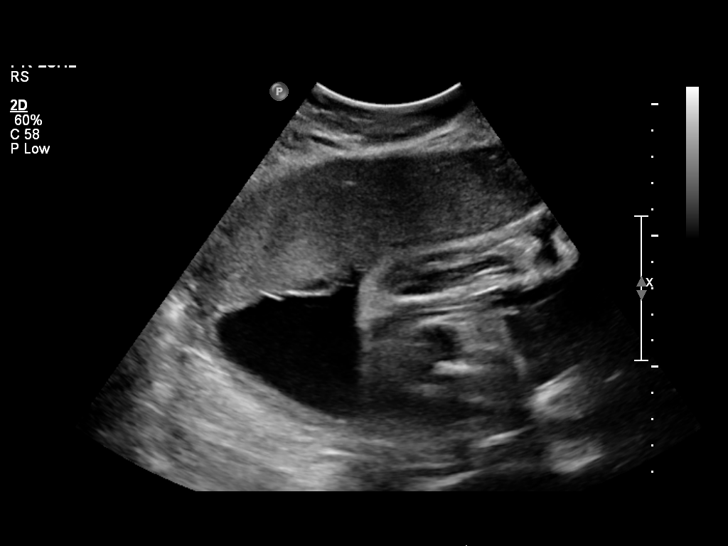
[im 6/21]
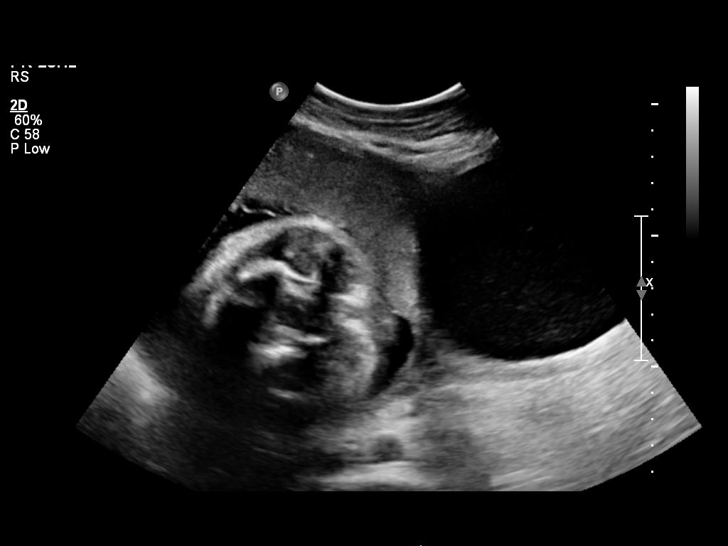
[im 7/21]
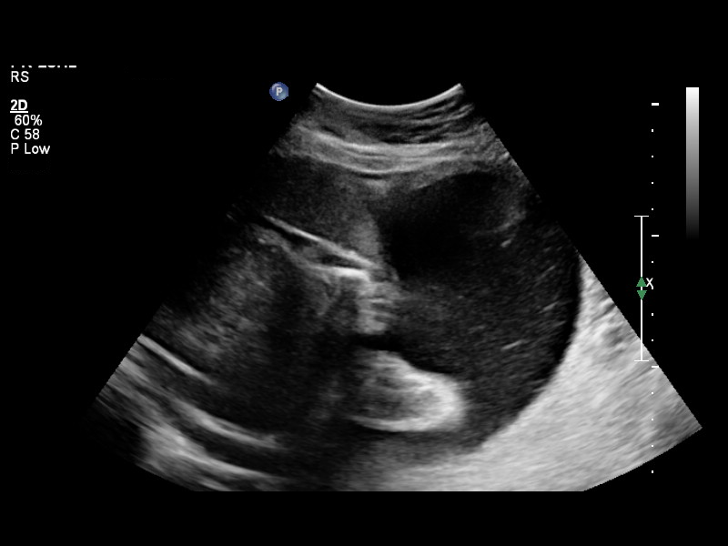
[im 9/21]
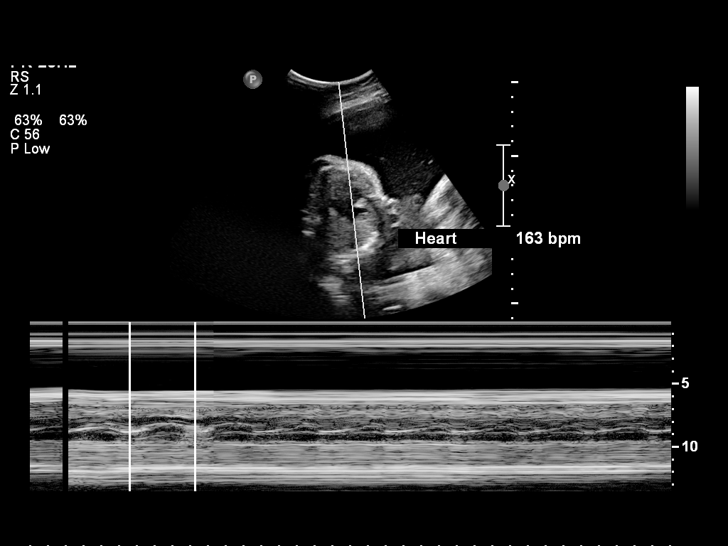
[im 10/21]
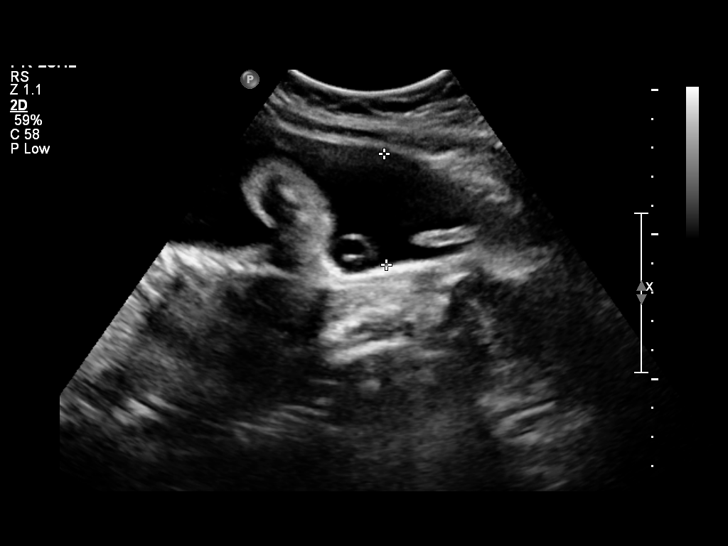
[im 12/21]
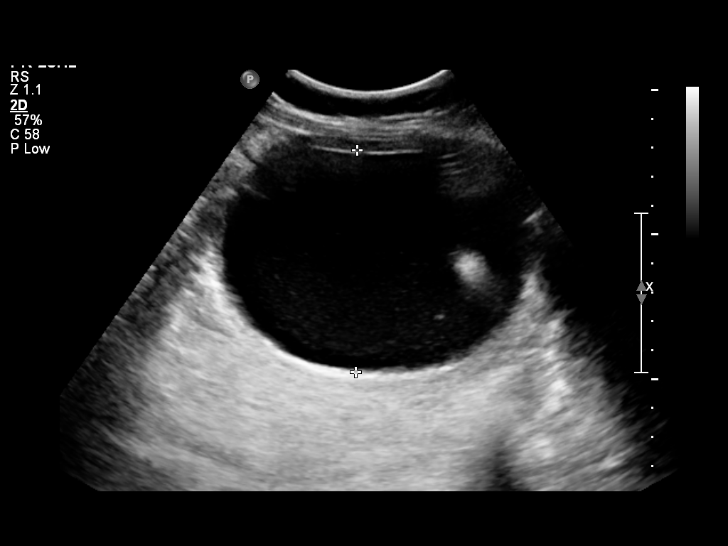
[im 13/21]
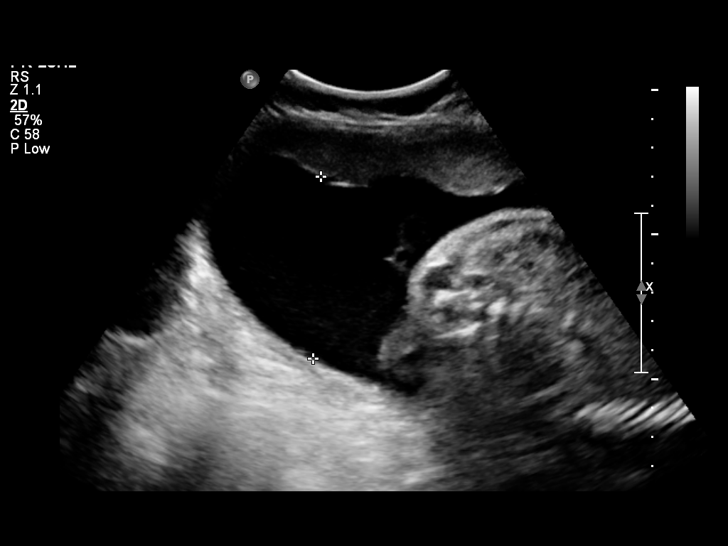
[im 15/21]
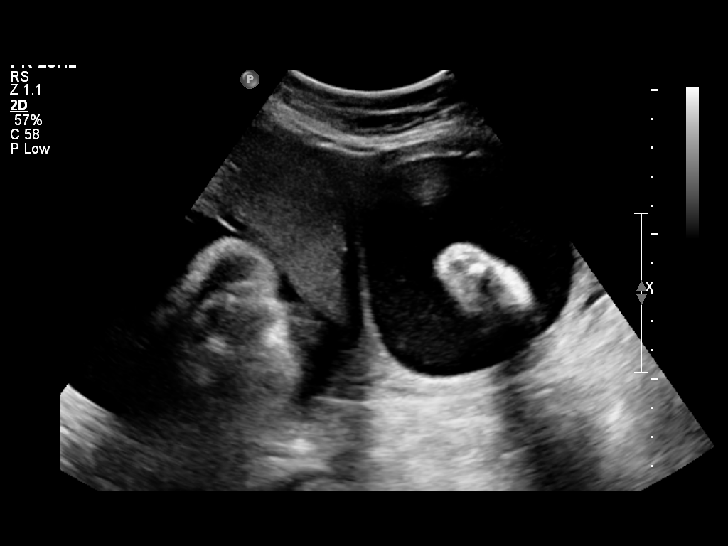
[im 16/21]
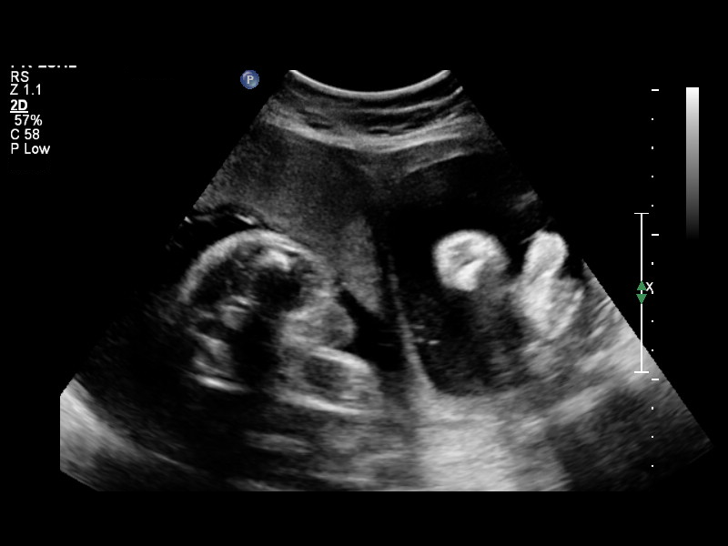
[im 18/21]
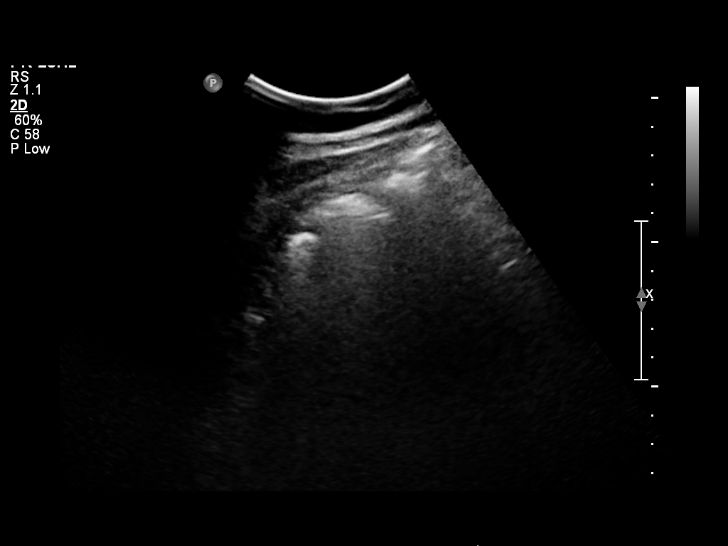
[im 19/21]
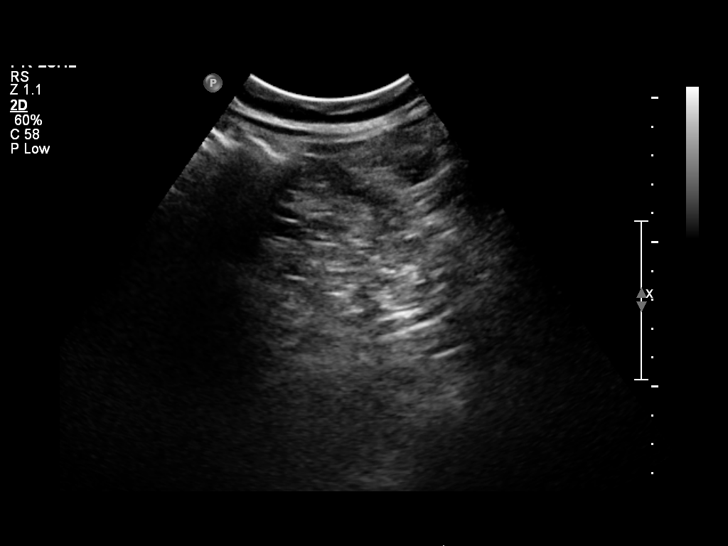
[im 21/21]
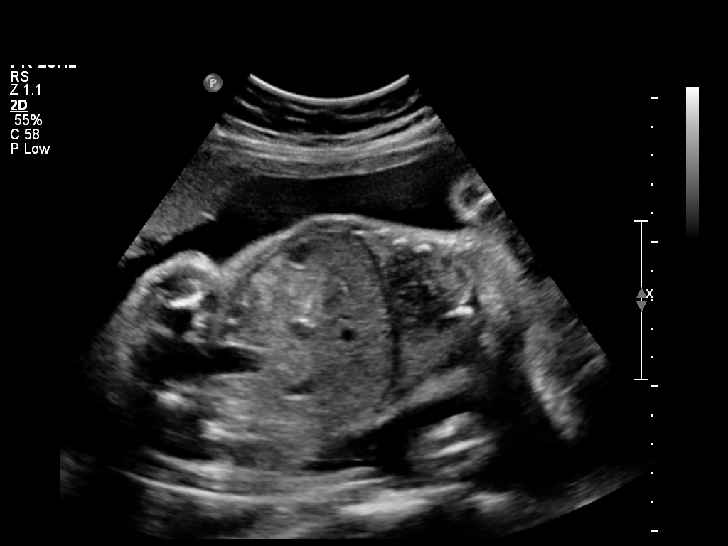

[14 of 21 positions shown; findings below may reference images not displayed]

OBSTETRICS REPORT
(Signed Final 06/02/2015 [DATE])

Service(s) Provided

[HOSPITAL]                                         76815.0
Indications

Premature rupture of membranes - leaking fluid
Mullerian defect
28 weeks gestation of pregnancy
Cervical shortening, 3rd (per outside scan)
Fetal Evaluation

Num Of Fetuses:    1
Fetal Heart Rate:  163                          bpm
Cardiac Activity:  Observed
Presentation:      Cephalic
Placenta:          Anterior, above cervical
os RIGHT UTRS

Amniotic Fluid
AFI FV:      Subjectively upper-normal
AFI Sum:     23.99   cm       96  %Tile     Larg Pckt:    7.65  cm
RUQ:   6.28    cm   RLQ:    3.83   cm    LUQ:   7.65    cm   LLQ:    6.23   cm
Gestational Age

Best:          28w 6d     Det. By:  Early Ultrasound         EDD:   08/19/15
(01/09/15)
Cervix Uterus Adnexa

Cervix:       No adaquately visualized
Left Ovary:    Not visualized.
Right Ovary:   Not visualized.
Impression

SIUP at 30w0d, pPROM per admitting provider
active fetus in cephalic presentation
polyhydramnios
no previa
incidental note is made of bicornuate versus septate uterus
Recommendations
See MFM consultation

questions or concerns.

## 2015-06-02 NOTE — Progress Notes (Signed)
Ultrasound tech into perform bedside ultrasound for growth as ordered.

## 2015-06-02 NOTE — Consult Note (Signed)
MFM Consultation, Staff Note:  Impressions: [facility] at [redacted]w[redacted]d on attempted comprehensive fetal survey for patient admitted with diagnosis of pPROM EFW 62nd% No dysmorphic features Limitations as detailed above No previa Amniotic fluid volume is increased No evidence of intraamniotic infection  Discussion: KIMBREE KOELZER  has been diagnosed and admitted for preterm premature rupture of membranes as established by her obstetrical provider. I was asked to evaluate her fetus and provide recommendations regarding the management of this diagnosis. I explained to her that in order for labor to occur either preterm or term, there has to be membrane activation, uterine contractility and cervical dilation, which of these manifest primarily varies from individual to individual and frequently it is a combination of synchronous and asynchronous activations that lead to eventual preterm delivery. Any number of precipitating events in combination may be the basis or pathologic activation of any of these pathways. Regardless, I explained to her that at this point our primary concern was for ascending intrauterine/intraamniotic infection, which poses risk to her as well as her fetus. She understood that inpatient management was essential with serial examinations and fetal heart rate tracings to screen for onset of infection and that evidence of such would prompt delivery.  I explained to her timing of delivery in absence of infection and in presence of reassuring fetal status has been debated historically and recently by many experts. Regardless, I cited to her that the best available and most well-accepted timing of delivery in context of otherwise reassuring maternal-fetus status affected by pPROM is at [redacted] weeks gestational age. This most fully balances the risk of prematurity against that of an occult intrauterine infection. Occult intrauterine infection is the second leading cause of cerebral palsy as opposed to the  leading cause which is, of course, prematurity. She seemed to grasp the concept as well as our specialty's rationale.  All of your patient's questions were addressed to her satisfaction today. Although this was a lot of information to take in, she and her husband demonstrated excellent comprehension of my impression, recommendations, and the underlying rationale.  Summary of Recommendations: 1. I agree with latency antibiotics course for 7 days; 2. Patient is receiving a course of betamethasone for reduction of fetal morbidity/mortality risk; If more than 14 days passes since administration of BMZ and there is reason to believe the patient may need delivery, I would consider a rescue course of steroids (but not more than two courses total for the pregnancy). 3. Timing of delivery should be anticipated by 34 weeks provided that testing remains reassuring and there is no evidence of chorioamnionitis. 4. I would deliver prior to 34 weeks for nonreassuring fetal testing, evidence of chorioamnionitis, or other evidence of maternal or fetal deterioration. 5. Interval growth should be continually reassessed every 3-4 weeks pregnancy additionally complicated by PPROM 6. The patient should be monitored by serial clinical exams, serial vital signs, daily EFM's/NST's (more often if clinically warranted), CBC only as clinically indicated (noting that WBC at or above 20,000 constitutes leukocytosis for pregnancy). 7. NICU consultation is recommended. 8. Of note, I cannot help but remark on the increased fluid volume.  It makes me suspicious about the diagnosis of pPROM.  Even if the exam were repeated today, the documentation of her admission is exam is clear that there was gross evidence of membrane rupture with enough fluid in the vault to preclude clear visualization of the cervix, a good patient history of leaking fluid over several days, and a positive Amnisure.  Hence,  I believe it to be prudent to  presumptively and empirically continue the treatment of pPROM.  However, I would recommend repeating AFI and speculum exam replete with ferning, nitrazine, pooling and Amnisure in about 5-7 days.  If AFI remains unchanged and all findings become negative, I would then question the diagnosis of pPROM.  I did discuss amniodye test with the patient should our unit be able to order and procure indigo carmine to safely facilitate the test to confirm pPROM.  However, the patient declined this test due to her concern for risk of preterm delivery (0.3% chance with amniodye test).  In brief, continue as above with repeat evaluation by speculum exam by OB service and repeat ultrasound by MFM in 1 week.  Thank you for consultation. It was a pleasure having the opportunity to contribute to the care of your patient. Please page with questions. I spent in excess of 60 minutes in consultation with your patient with more than 50% of this time in direct face-to-face counseling and education.  Thank you,  Louann Sjogren Gaynelle Arabian, Louann Sjogren, MD, MS, FACOG Assistant Professor Section of Maternal-Fetal Medicine [facility]

## 2015-06-02 NOTE — H&P (Addendum)
  No chief complaint on file.  HPI Comments: Anna Brewer is a 29 y.o. G1P0 at [redacted]w[redacted]d who presents today with lower abdominal pain and leaking of fluid. She states that she was seen about 2 days ago in the office, and was dx with UTI. She also had a cervix measured, and it was 1.8cm. She has an appointment on Monday to recheck cervical length. She states that she feels like her discharge has been a lot more more watery today. She denies any vaginal bleeding. She states that the fetus has been active. She rates her current pain 4/10.   Pt was diagnosed with Uterine septum at 18 week [place].  Recent US showed cx change and pt was taken out of work.  Normal 1st tri screen and declined AFP   Past Medical History  Diagnosis Date  . Asthma   . Acid reflux   . Migraine     Past Surgical History  Procedure Laterality Date  . Tonsillectomy    . Wisdom tooth extraction      History reviewed. No pertinent family history.  History  Substance Use Topics  . Smoking status: Never Smoker   . Smokeless tobacco: Not on file  . Alcohol Use: No    Allergies: No Known Allergies  Prescriptions prior to admission  Medication Sig Dispense Refill Last Dose  . cephALEXin (KEFLEX) 500 MG capsule Take 500 mg by mouth 3 (three) times daily.   06/01/2015 at Unknown time  . Prenatal Vit-Fe Fumarate-FA (PRENATAL MULTIVITAMIN) TABS tablet Take 1 tablet by mouth daily at 12 noon.   06/01/2015 at Unknown time    Review of Systems  Constitutional: Negative for fever.  Gastrointestinal: Positive for abdominal pain. Negative for nausea, vomiting, diarrhea and constipation.  Genitourinary: Negative for dysuria, urgency and frequency.   Physical Exam   Blood pressure 121/79, pulse 91, temperature 98.2 F (36.8 C), temperature source Oral, resp. rate 20, height 5\' 7"  (1.702 m), weight 78.472 kg (173 lb), SpO2 98 %.  Physical Exam  Nursing note and  vitals reviewed. Constitutional: She is oriented to person, place, and time. She appears well-developed and well-nourished. No distress.  HENT:  Head: Normocephalic.  Cardiovascular: Normal rate.  Respiratory: Effort normal.  GI: Soft. There is no tenderness. There is no rebound.  Genitourinary:   External: no lesion Vagina: pooling of clear fluid  Cervix: pink, smooth, difficult to visualize well with fluid  Uterus: AGA  Neurological: She is alert and oriented to person, place, and time.  Skin: Skin is warm and dry.  Psychiatric: She has a normal mood and affect.   FHT: 140, moderate with 15x15 accels, no decels Toco: no UCs    Lab Results Last 24 Hours    Results for orders placed or performed during the hospital encounter of 06/01/15 (from the past 24 hour(s))  Amnisure rupture of membrane (rom)not at [facility] Status: None   Collection Time: 06/01/15 11:20 PM  Result Value Ref Range   Amnisure ROM POSITIVE       MAU Course  Procedures  MDM    Assessment and Plan  PPROM.  Admit for Monitoring, latency abx, BMZ.  If labors then Mag or for CP prophylaxis.  [place] for fetal presentation and AFI

## 2015-06-02 NOTE — Progress Notes (Signed)
Late entry note (rounded on patient at 1000) No complaints.  Scant leakage of fluid.  No CTX.  Active FM.    U/S vtx, AFI 23  VSS. AF. Gen: A&O x 3 Abd: soft, NT Ext: no c/c/e  28yo G1 at [redacted]w[redacted]d with PPROM -Complete bmz series -Continue latency abx -Recheck amnisure and u/s in 1 week -s/p MFM consult -Will consult NICU -Monitor for s/sx of chorio  Mitchel Honour, DO

## 2015-06-02 NOTE — Consult Note (Signed)
Neonatology Consult Note:  At the request of the patients obstetrician Dr. Corinna Capra I met with Anna Brewer, husband and her parents.  She is currently at 28 6 weeks with pregnancy complicated by PPROM.  Interestingly her AFI was robust however PPROM was confirmed with a with positive Amnisure.  She is being treated with latency antibiotics and will get her second dose of betamethasone tonight.   We discussed morbidity/mortality at this gestional age, delivery room resuscitation, including intubation and surfactant in DR.  Discussed mechanical ventilation and risk for chronic lung disease, risk for IVH with potential for motor / cognitive deficits, ROP, NEC, sepsis, as well as temperature instability and feeding immaturity.  Discussed NG / OG feeds, benefits of MBM in reducing incidence of NEC.   Discussed likely length of stay.  Thank you for allowing [place] to participate in her care.  Please call with questions.  Higinio Roger, DO  Neonatologist  The total length of face-to-face or floor / unit time for this encounter was 30 minutes.  Counseling and / or coordination of care was greater than fifty percent of the time.

## 2015-06-03 LAB — CULTURE, BETA STREP (GROUP B ONLY)

## 2015-06-03 MED ORDER — FAMOTIDINE 20 MG PO TABS
20.0000 mg | ORAL_TABLET | Freq: Two times a day (BID) | ORAL | Status: DC
  Administered 2015-06-03 – 2015-06-25 (×45): 20 mg via ORAL
  Filled 2015-06-03 (×45): qty 1

## 2015-06-03 MED ORDER — TETANUS-DIPHTH-ACELL PERTUSSIS 5-2.5-18.5 LF-MCG/0.5 IM SUSP
0.5000 mL | Freq: Once | INTRAMUSCULAR | Status: AC
  Administered 2015-06-03: 0.5 mL via INTRAMUSCULAR
  Filled 2015-06-03: qty 0.5

## 2015-06-03 MED ORDER — RANITIDINE HCL 150 MG/10ML PO SYRP: 150.0000 mg | ORAL_SOLUTION | Freq: Two times a day (BID) | ORAL | Status: DC

## 2015-06-03 NOTE — Progress Notes (Signed)
No complaints. Scant leakage of fluid. No CTX. Active FM.   U/S vtx, AFI 25, CL 1.2 cm  VSS. AF. Gen: A&O x 3 Abd: soft, NT Ext: no c/c/e  28yo G1 at [redacted]w[redacted]d with PPROM -Complete bmz series -Continue latency abx -Recheck amnisure and u/s in 1 week -s/p MFM and NICU consult -Monitor for s/sx of chorio  Mitchel Honour, DO

## 2015-06-04 MED ORDER — AMOXICILLIN 500 MG PO CAPS
500.0000 mg | ORAL_CAPSULE | Freq: Three times a day (TID) | ORAL | Status: AC
  Administered 2015-06-04 – 2015-06-09 (×14): 500 mg via ORAL
  Filled 2015-06-04 (×15): qty 1

## 2015-06-04 MED ORDER — AZITHROMYCIN 250 MG PO TABS
250.0000 mg | ORAL_TABLET | Freq: Every day | ORAL | Status: AC
Start: 2015-06-05 — End: 2015-06-09
  Administered 2015-06-05 – 2015-06-09 (×5): 250 mg via ORAL
  Filled 2015-06-04 (×5): qty 1

## 2015-06-04 NOTE — Progress Notes (Signed)
No complaints. Scant leakage of fluid. No CTX. Active FM.   U/S vtx, AFI 25, CL 1.2 cm  VSS. AF. Gen: A&O x 3 Abd: soft, NT Ext: no c/c/e  28yo G1 at [redacted]w[redacted]d with PPROM -Bmz series complete -Continue latency abx (D3/7) -Recheck amnisure and u/s Thursday -s/p MFM and NICU consult -Monitor for s/sx of chorio  Mitchel Honour, DO

## 2015-06-04 NOTE — Plan of Care (Signed)
Problem: Consults Goal: Birthing Suites Patient Information Press F2 to bring up selections list   Pt < [redacted] weeks EGA     

## 2015-06-05 LAB — TYPE AND SCREEN
ABO/RH(D): O POS
ANTIBODY SCREEN: NEGATIVE

## 2015-06-05 MED ORDER — SODIUM CHLORIDE 0.9 % IJ SOLN
3.0000 mL | Freq: Two times a day (BID) | INTRAMUSCULAR | Status: DC
Start: 2015-06-05 — End: 2015-06-09
  Administered 2015-06-05 – 2015-06-08 (×6): 3 mL via INTRAVENOUS

## 2015-06-05 NOTE — Progress Notes (Signed)
Dr. Rana Snare informed of pt's uterine activity & nurse interventions (encouraging to void & initiation of IVF's)  Will continue observation for now and call if any changes noted

## 2015-06-05 NOTE — Progress Notes (Signed)
Initial Nutrition Assessment  DOCUMENTATION CODES:  Not applicable  INTERVENTION:  Snacks/Regular diet  NUTRITION DIAGNOSIS:  Increased nutrient needs related to  (pregnancy and fetal growth requirments) as evidenced by  ([redacted] weeks gestation).  GOAL:  Patient will meet greater than or equal to 90% of their needs   MONITOR:  Weight trends  REASON FOR ASSESSMENT:   (Antenatal)    ASSESSMENT:  29 2/7 weeks with PROM. Pre-pregnancy weight 147 lbs. Pre-pregnancy BMI 23.1. 26 lb weight gain. Weight gain goals 25-35 lbs. Diet tolerated well. May order double protein portions, snacks TID and from retail   Height:  Ht Readings from Last 1 Encounters:  06/02/15 5\' 7"  (1.702 m)    Weight:  Wt Readings from Last 1 Encounters:  06/02/15 173 lb (78.472 kg)    Ideal Body Weight:     Wt Readings from Last 10 Encounters:  06/02/15 173 lb (78.472 kg)  07/02/12 140 lb (63.504 kg)  10/23/11 100 lb 8 oz (45.587 kg)  09/09/11 137 lb (62.143 kg)    BMI:  Body mass index is 27.09 kg/(m^2).  Estimated Nutritional Needs:  Kcal:  1900-2100  Protein:  80-90 g  Fluid:  2.2 L  Diet Order:  Diet regular Room service appropriate?: Yes; Fluid consistency:: Thin  EDUCATION NEEDS:  No education needs identified at this time  No intake or output data in the 24 hours ending 06/05/15 1319   [facility] M.Odis Luster LDN Neonatal Nutrition Support Specialist/RD III Pager [phone]      Phone [phone]

## 2015-06-05 NOTE — Progress Notes (Signed)
Patient ID: Anna Brewer, female   DOB: 1986/03/28, 29 y.o.   MRN: [id number] Pt without complaints GFM ? Leaking only when wiping after toilet  VSSAF FHR 140s Cat 1 Ctxs rare  Abd Gravid, nt Neg homans  28yo G1 at 29wd with PPROM -Bmz series complete -Continue latency abx (D4/7) -Recheck amnisure and u/s Thursday -s/p MFM and NICU consult -Monitor for s/sx of chorio

## 2015-06-06 NOTE — Progress Notes (Signed)
S:  Patient is stable. Had some contractions last night but those have decreased. No leakage of fluid overnight.   O:  BP 105/67 mmHg  Pulse 68  Temp(Src) 98.6 F (37 C) (Oral)  Resp 18  Ht 5\' 7"  (1.702 m)  Wt 78.472 kg (173 lb)  BMI 27.09 kg/m2  SpO2 100% General alert and oriented Abdomen is soft and non tender No results found for this or any previous visit (from the past 24 hour(s)). IMPRESSION: IUP at 29 w 3 days PPROM  PLAN: This may be a high leak - AFI after admission was 25.  Continue IV Antibiotics Completed steroid series MFM consult recommendations - reevaluate on Thursday for AFI and continued leak. Plan reviewed with patient

## 2015-06-07 NOTE — Progress Notes (Signed)
S: Patient is stable.Scant LOF.  No ctx or vb.  O:AF, VSS General alert and oriented Abdomen is soft and non tender        IMPRESSION: IUP at 29 w 4 days PPROM  PLAN: This may be a high leak - AFI after admission was 25.  Continue IV Antibiotics S/p BMZ MFM consult recommendations - reevaluate on Thursday for AFI and continued leak. Plan reviewed with patient

## 2015-06-08 ENCOUNTER — Inpatient Hospital Stay (HOSPITAL_COMMUNITY): Payer: 59

## 2015-06-08 DIAGNOSIS — O4290 Premature rupture of membranes, unspecified as to length of time between rupture and onset of labor, unspecified weeks of gestation: Secondary | ICD-10-CM | POA: Insufficient documentation

## 2015-06-08 DIAGNOSIS — Z3A29 29 weeks gestation of pregnancy: Secondary | ICD-10-CM | POA: Insufficient documentation

## 2015-06-08 LAB — TYPE AND SCREEN
ABO/RH(D): O POS
Antibody Screen: NEGATIVE

## 2015-06-08 IMAGING — US US OB LIMITED
1 series · 13 of 19 positions shown · non-contrast
Comparison: none

[Series 1: us ob limited · 0.23mm/px · 19 acquisitions, 13 frames shown]
[im 1/19]
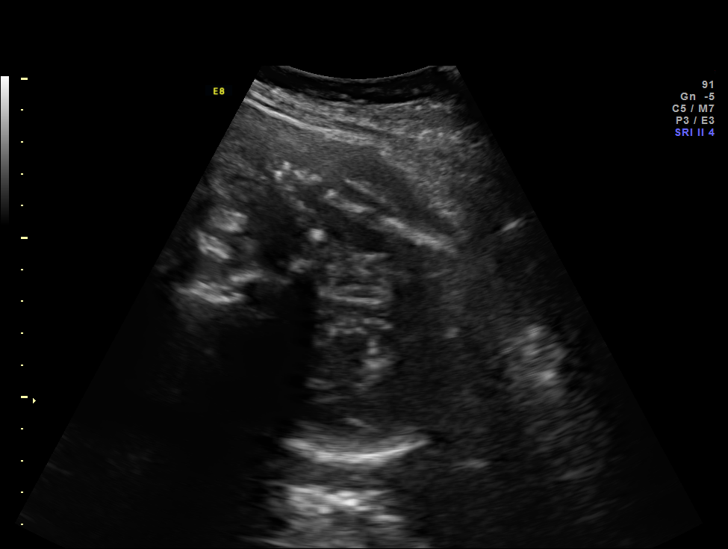
[im 3/19]
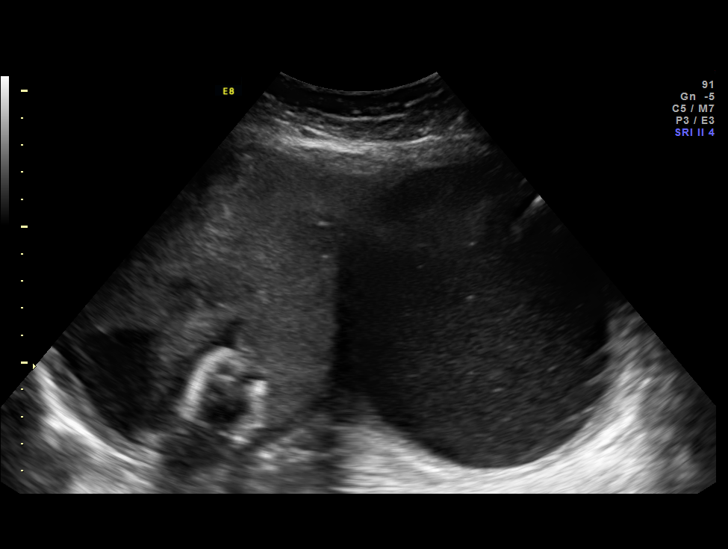
[im 4/19]
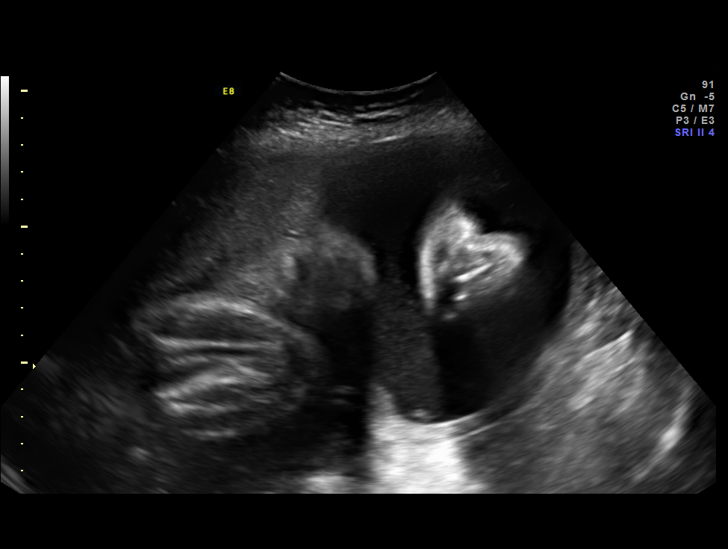
[im 6/19]
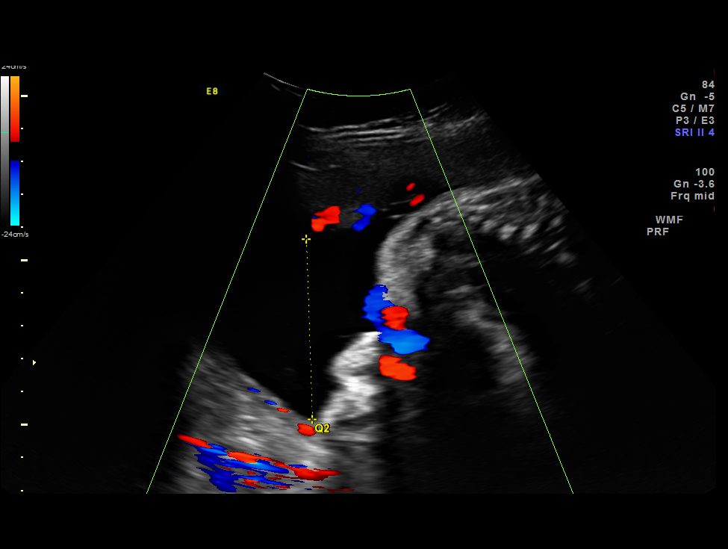
[im 7/19]
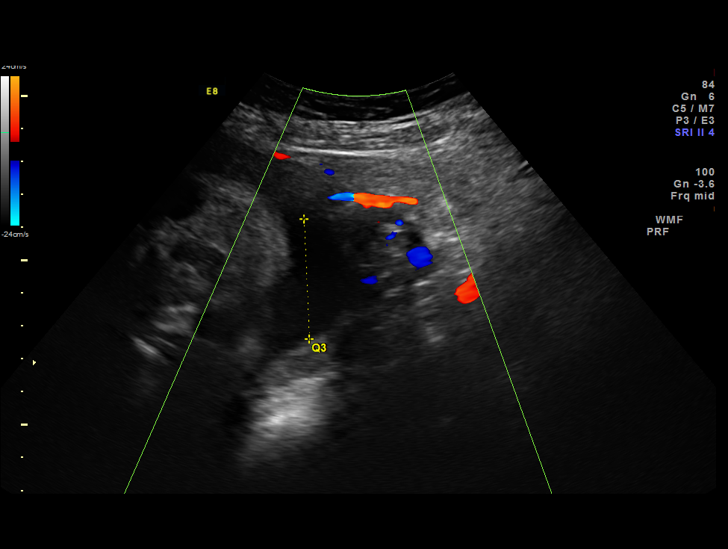
[im 9/19]
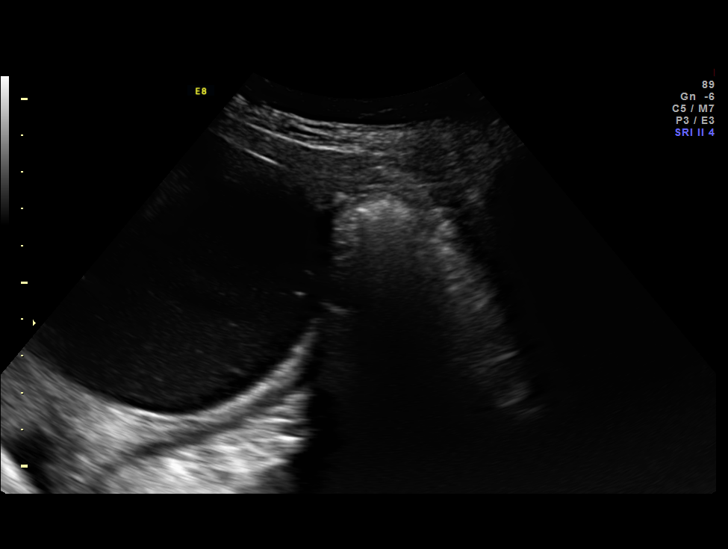
[im 10/19]
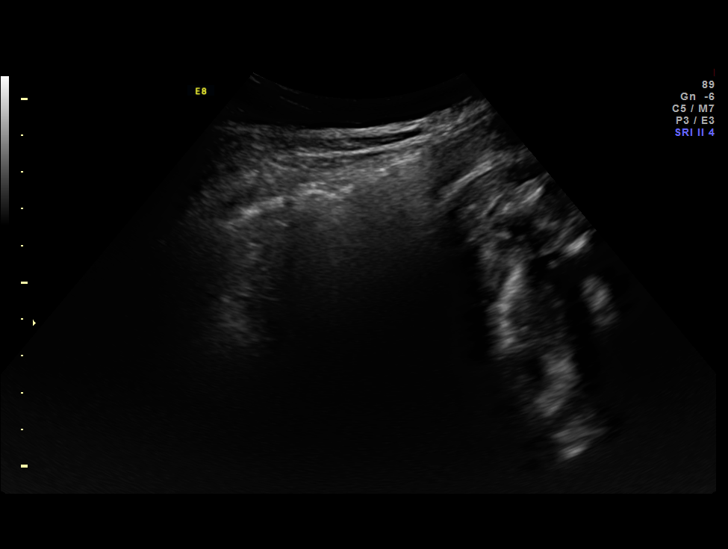
[im 11/19]
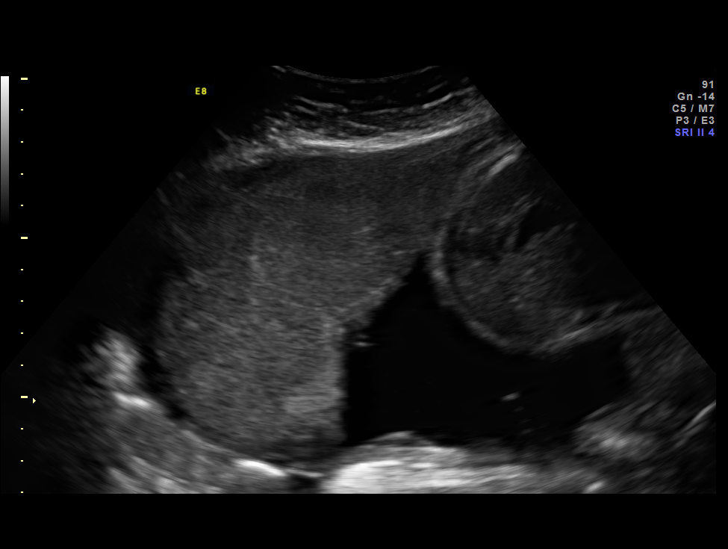
[im 13/19]
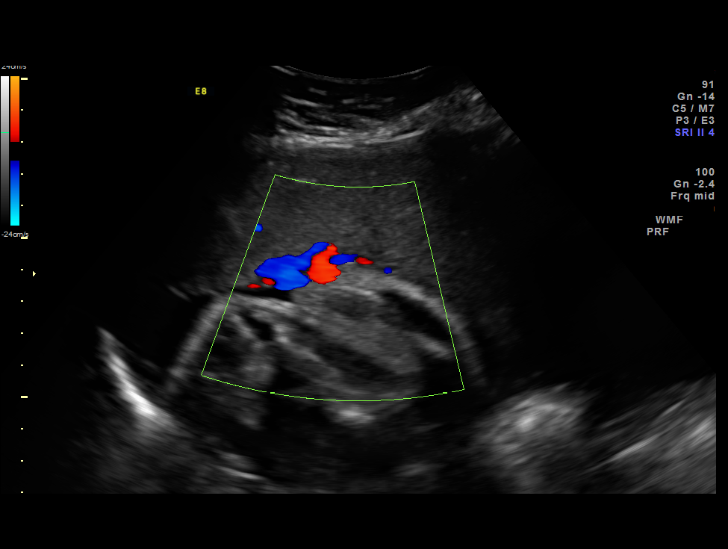
[im 14/19]
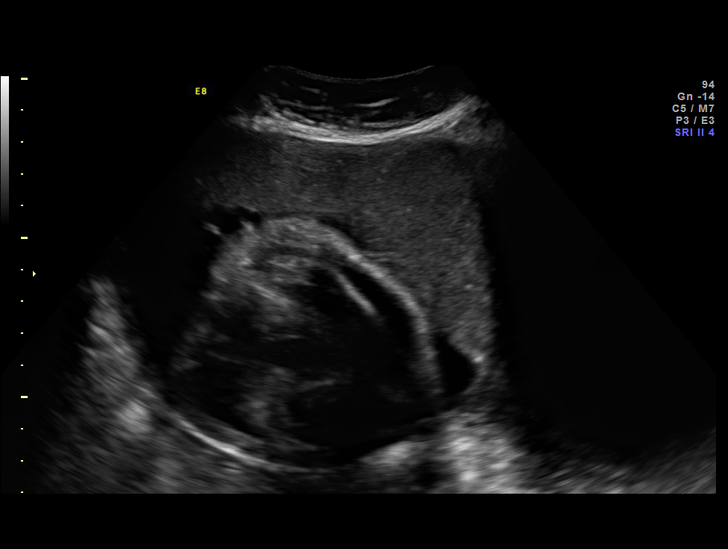
[im 16/19]
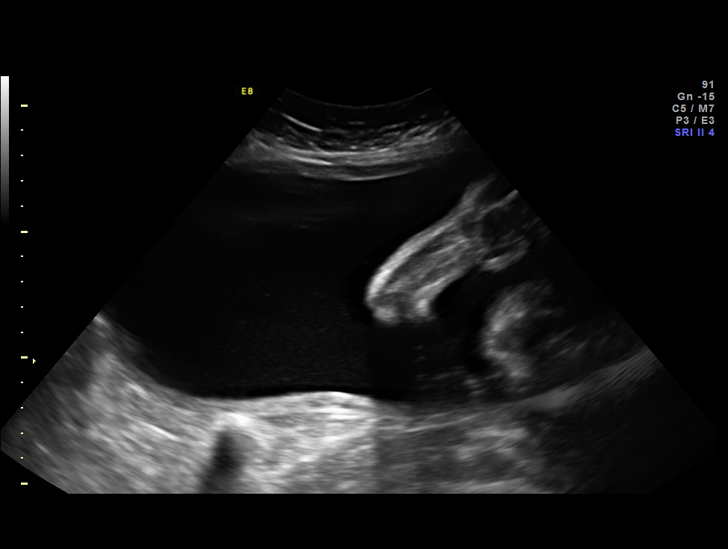
[im 17/19]
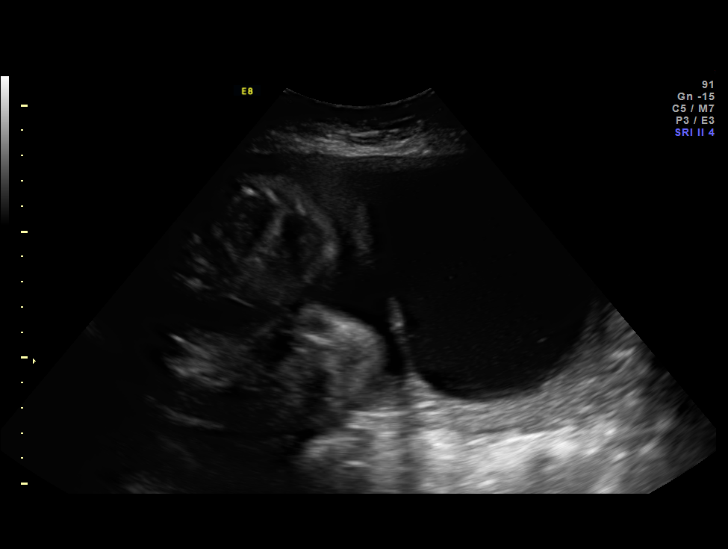
[im 19/19]
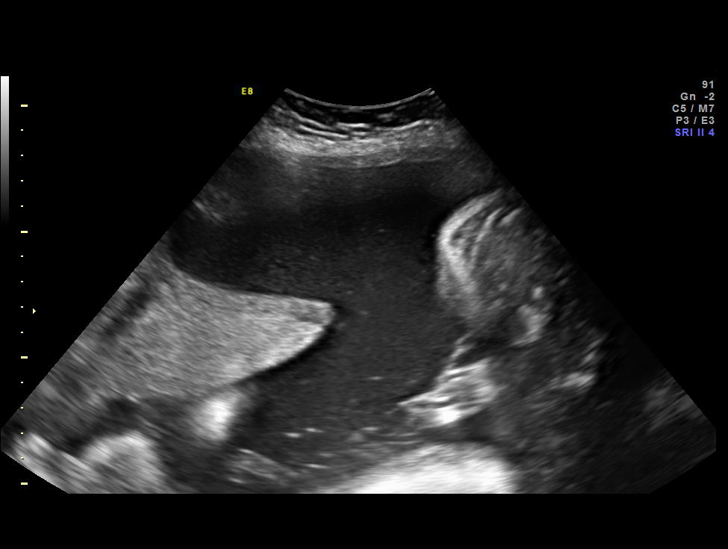

[13 of 19 positions shown; findings below may reference images not displayed]

OBSTETRICS REPORT
(Signed Final 06/08/2015 [DATE])

Date:

Service(s) Provided

[HOSPITAL]                                          76815.0
Indications

29 weeks gestation of pregnancy
Premature rupture of membranes - leaking fluid
(+amnisure)
Uterine abnormality during pregnancy (mullerian
defect)
Cervical shortening, 3rd (per outside scan)
Fetal Evaluation

Num Of             1
Fetuses:
Fetal Heart        163                          bpm
Rate:
Cardiac Activity:  Observed
Presentation:      Cephalic
Placenta:          Anterior, above cervical
os (right horn)
P. Cord            Visualized, central
Insertion:

Amniotic Fluid
AFI FV:      Subjectively increased
AFI Sum:     24.41    cm      96  %Tile     Larg Pckt:      9.4  cm
RUQ:   9.4     cm    RLQ:   5.87    cm   LUQ:    5.49    cm   LLQ:    3.65   cm
Gestational Age

Best:          29w 5d    Det. By:   Early Ultrasound          EDD:   08/19/15
(01/09/15)
Cervix Uterus Adnexa

Cervix:       Not visualized (advanced GA >54wks)
Uterus:       No abnormality visualized.
Cul De Sac:   No free fluid seen.
Left Ovary:    Not visualized.
Right Ovary:   Not visualized.

Adnexa:     No abnormality visualized.
Impression

Single IUP at 29w 5d
PROM, suspected uternine anomaly (septate vs.
bicornuate uterus)
Limited ultrasound performed for amniotic fluid volume
Cephalic presentation
Subjectively increased amniotic fluid volume noted (AFI 24
cm)
Anterior placenta - partially attached to what appears to be
a uterine septum

---------------------------------------------------------------------- Recommendations

Recommend ultrasounds for growth every 3-4 weeks
Continued inpatient management

## 2015-06-08 MED ORDER — NYSTATIN 100000 UNIT/GM EX CREA
TOPICAL_CREAM | Freq: Two times a day (BID) | CUTANEOUS | Status: DC | PRN
  Administered 2015-06-08 – 2015-06-11 (×2): via TOPICAL

## 2015-06-08 MED ORDER — NYSTATIN 100000 UNIT/GM EX CREA
TOPICAL_CREAM | Freq: Two times a day (BID) | CUTANEOUS | Status: DC
  Filled 2015-06-08: qty 15

## 2015-06-08 NOTE — Progress Notes (Signed)
Patient ID: Anna Brewer, female   DOB: 1986-07-24, 29 y.o.   MRN: [id number] S: STILL WITH LEAKAGE O: AF VSS      GRAVID UTERUS NONTENDER      FHR CAT ONE NO CTX A: IUP AT 29.5 WITH PPROM P: MFM EVAL AND SONO

## 2015-06-09 NOTE — Progress Notes (Signed)
[redacted]w[redacted]d   S// min leakage  O// BP 95/63 mmHg  Pulse 81  Temp(Src) 98.4 F (36.9 C) (Oral)  Resp 18  Ht 5\' 7"  (1.702 m)  Wt 173 lb 12.8 oz (78.835 kg)  BMI 27.21 kg/m2  SpO2 100%  MFM [place] yest shows incr AFI  A+P// [redacted]w[redacted]d PPROM, now w/ incr fluid>>.MFM rec cont inpt mgmt

## 2015-06-10 NOTE — Progress Notes (Signed)
[redacted]w[redacted]d  S/  Some spotting last pm O// BP 103/65 mmHg  Pulse 72  Temp(Src) 98.2 F (36.8 C) (Oral)  Resp 18  Ht 5\' 7"  (1.702 m)  Wt 173 lb 12.8 oz (78.835 kg)  BMI 27.21 kg/m2  SpO2 100%  [place] Friday>>vtx, AFI 24 w/ poss ut septum vs bicornuate uterus, w/ ant placenta partially covering septum  A+P// [redacted]w[redacted]d,PPROM, MFM rec cont inpt mgmt w/ growth [place] 3 wks

## 2015-06-10 NOTE — Progress Notes (Signed)
Pt states has noted occasional smears of blood when wiping but has not had any on her pad

## 2015-06-11 LAB — TYPE AND SCREEN
ABO/RH(D): O POS
Antibody Screen: NEGATIVE

## 2015-06-11 NOTE — Progress Notes (Signed)
[redacted]w[redacted]d  S/  Some spotting mixed with min AF  O/  BP 95/55 mmHg  Pulse 82  Temp(Src) 98.5 F (36.9 C) (Oral)  Resp 18  Ht 5\' 7"  (1.702 m)  Wt 173 lb 12.8 oz (78.835 kg)  BMI 27.21 kg/m2  SpO2 100%  stable FHR  A+P// [redacted]w[redacted]d , PPROM, AFI now 24, cont inpt mgmt

## 2015-06-12 MED ORDER — DIPHENHYDRAMINE-ZINC ACETATE 2-0.1 % EX CREA
TOPICAL_CREAM | Freq: Every day | CUTANEOUS | Status: DC | PRN
  Administered 2015-06-12: 1 via TOPICAL
  Filled 2015-06-12: qty 28

## 2015-06-12 MED ORDER — DIPHENHYDRAMINE-ZINC ACETATE 2-0.1 % EX CREA: TOPICAL_CREAM | Freq: Four times a day (QID) | CUTANEOUS | Status: DC | PRN

## 2015-06-12 MED ORDER — HYDROXYZINE HCL 50 MG/ML IM SOLN
25.0000 mg | Freq: Four times a day (QID) | INTRAMUSCULAR | Status: DC | PRN
  Administered 2015-06-12: 25 mg via INTRAMUSCULAR
  Filled 2015-06-12 (×2): qty 0.5

## 2015-06-12 NOTE — Progress Notes (Signed)
Pt denies pain & vb.  Good FM.  occ LOF.  AF, VSS Abd - gravid, NT Ext - NT  A/P:  PPROM, 30+2 wks Continue inpt bedrest

## 2015-06-12 NOTE — Progress Notes (Signed)
Pt continues to c/o itching, rash now noted on chest area.  RN suggested pt shower, change clothing, & reapply Benadryl cream. Pt verbalized understanding & agreeable.

## 2015-06-12 NOTE — Progress Notes (Signed)
Pt c/o abdomen itching, noted to have red rash with few hives.  Pt denies any changes in detergent, no new meds given.  Pt reports noticed rash this am d/t itching.

## 2015-06-13 LAB — COMPREHENSIVE METABOLIC PANEL
ALT: 13 U/L — AB (ref 14–54)
AST: 17 U/L (ref 15–41)
Albumin: 2.9 g/dL — ABNORMAL LOW (ref 3.5–5.0)
Alkaline Phosphatase: 79 U/L (ref 38–126)
Anion gap: 11 (ref 5–15)
BUN: 6 mg/dL (ref 6–20)
CALCIUM: 8.9 mg/dL (ref 8.9–10.3)
CHLORIDE: 105 mmol/L (ref 101–111)
CO2: 23 mmol/L (ref 22–32)
Creatinine, Ser: 0.55 mg/dL (ref 0.44–1.00)
GFR calc Af Amer: >60 mL/min (ref >60)
GLUCOSE: 107 mg/dL — AB (ref 65–99)
POTASSIUM: 3.4 mmol/L — AB (ref 3.5–5.1)
SODIUM: 139 mmol/L (ref 135–145)
Total Bilirubin: 0.4 mg/dL (ref 0.3–1.2)
Total Protein: 6.6 g/dL (ref 6.5–8.1)

## 2015-06-13 LAB — CBC
HCT: 35.3 % — ABNORMAL LOW (ref 36.0–46.0)
HEMOGLOBIN: 11.9 g/dL — AB (ref 12.0–15.0)
MCH: 30.6 pg (ref 26.0–34.0)
MCHC: 33.7 g/dL (ref 30.0–36.0)
MCV: 90.7 fL (ref 78.0–100.0)
Platelets: 214 10*3/uL (ref 150–400)
RBC: 3.89 MIL/uL (ref 3.87–5.11)
RDW: 12.7 % (ref 11.5–15.5)
WBC: 18.4 10*3/uL — ABNORMAL HIGH (ref 4.0–10.5)

## 2015-06-13 MED ORDER — DIPHENHYDRAMINE HCL 25 MG PO CAPS
25.0000 mg | ORAL_CAPSULE | Freq: Four times a day (QID) | ORAL | Status: DC | PRN
Start: 2015-06-13 — End: 2015-06-26
  Administered 2015-06-13: 25 mg via ORAL
  Filled 2015-06-13: qty 1

## 2015-06-13 MED ORDER — HYDROCORTISONE 1 % EX CREA
1.0000 "application " | TOPICAL_CREAM | Freq: Three times a day (TID) | CUTANEOUS | Status: DC | PRN
  Administered 2015-06-13: 1 via TOPICAL
  Filled 2015-06-13: qty 28

## 2015-06-13 NOTE — Progress Notes (Signed)
30 3/7 weeks Last small leak of fluid yesterday, no bleeding, no contractions C/O rash starting yesterday on trunk, breasts, arms-little relief with benadryl cream and vistaril injection No palmar itching  VSS Afeb abd gravid NT, no epigastric tenderness Erythematous, punctate rash over trunk, breasts and arms   No palmar rash  FHT cat one  A/P: 1) PPROM-continue BR inpatient         2) rash-hydrocortisone cream prn, check CBC, CMET, bile acids

## 2015-06-14 LAB — TYPE AND SCREEN
ABO/RH(D): O POS
Antibody Screen: NEGATIVE

## 2015-06-14 LAB — BILE ACIDS, TOTAL: BILE ACIDS TOTAL: 8.6 umol/L (ref 4.7–24.5)

## 2015-06-14 NOTE — Progress Notes (Signed)
Patient ID: Anna Brewer, female   DOB: 04/19/1986, 29 y.o.   MRN: [id number] Pt c/o PUPPS rash itching GFM  Occas rare leaking No vag bleeding  VSSAF FHR cat 1 Ctxs rare  Abd uritcarial rash on trunk Nt  Neg homans  IUP at 30 4/7 PPROM/PTL - stable S/P BMZ and latency abx AFI normal range - stable Cont expectant management PUPPs - sxs treatment

## 2015-06-15 MED ORDER — METHYLPREDNISOLONE 4 MG PO TBPK: 8.0000 mg | ORAL_TABLET | Freq: Every evening | ORAL | Status: DC

## 2015-06-15 MED ORDER — METHYLPREDNISOLONE 4 MG PO TABS
4.0000 mg | ORAL_TABLET | Freq: Every day | ORAL | Status: AC
Start: 2015-06-16 — End: 2015-06-17
  Administered 2015-06-16 – 2015-06-17 (×2): 4 mg via ORAL
  Filled 2015-06-15 (×2): qty 1

## 2015-06-15 MED ORDER — METHYLPREDNISOLONE 4 MG PO TBPK: 4.0000 mg | ORAL_TABLET | Freq: Four times a day (QID) | ORAL | Status: DC

## 2015-06-15 MED ORDER — METHYLPREDNISOLONE 4 MG PO TABS
4.0000 mg | ORAL_TABLET | Freq: Every day | ORAL | Status: AC
  Administered 2015-06-16 – 2015-06-18 (×3): 4 mg via ORAL
  Filled 2015-06-15 (×3): qty 1

## 2015-06-15 MED ORDER — METHYLPREDNISOLONE 4 MG PO TBPK: 4.0000 mg | ORAL_TABLET | ORAL | Status: DC

## 2015-06-15 MED ORDER — METHYLPREDNISOLONE 4 MG PO TABS
4.0000 mg | ORAL_TABLET | Freq: Every day | ORAL | Status: AC
  Administered 2015-06-16 – 2015-06-20 (×5): 4 mg via ORAL
  Filled 2015-06-15 (×5): qty 1

## 2015-06-15 MED ORDER — METHYLPREDNISOLONE 4 MG PO TBPK: 8.0000 mg | ORAL_TABLET | Freq: Every morning | ORAL | Status: DC

## 2015-06-15 MED ORDER — METHYLPREDNISOLONE 4 MG PO TABS
4.0000 mg | ORAL_TABLET | Freq: Every day | ORAL | Status: AC
  Administered 2015-06-17 – 2015-06-19 (×3): 4 mg via ORAL
  Filled 2015-06-15 (×3): qty 1

## 2015-06-15 MED ORDER — METHYLPREDNISOLONE 4 MG PO TABS
12.0000 mg | ORAL_TABLET | ORAL | Status: AC
  Administered 2015-06-15 (×2): 12 mg via ORAL
  Filled 2015-06-15 (×2): qty 3

## 2015-06-15 MED ORDER — METHYLPREDNISOLONE 4 MG PO TABS
8.0000 mg | ORAL_TABLET | Freq: Every day | ORAL | Status: AC
  Administered 2015-06-16: 8 mg via ORAL
  Filled 2015-06-15: qty 2

## 2015-06-15 MED ORDER — METHYLPREDNISOLONE 4 MG PO TBPK: 4.0000 mg | ORAL_TABLET | Freq: Three times a day (TID) | ORAL | Status: DC

## 2015-06-15 NOTE — Progress Notes (Signed)
Persistent PUPPs sx.  Not responding to current meds. Scant leakage of fluid. No CTX. Active FM.   VSS. AF. Gen: A&O x 3 Abd: soft, NT Ext: no c/c/e  28yo G1 at 4141w5d with PPROM -s/p BMZ and latency abx -Monitor for s/sx of chorio -PUPPs-will try steroid taper  Mitchel HonourMegan Tashima Scarpulla, DO

## 2015-06-16 NOTE — Progress Notes (Signed)
Patient is resting well.  Reports itching is slightly improved. Leaking a small amount only. Denies vaginal bleeding. Reports infrequent contractions.  BP 115/69 mmHg  Pulse 109  Temp(Src) 97.9 F (36.6 C) (Oral)  Resp 18  Ht 5\' 7"  (1.702 m)  Wt 77.429 kg (170 lb 11.2 oz)  BMI 26.73 kg/m2  SpO2 100% No results found for this or any previous visit (from the past 24 hour(s)). General alert and oriented Lung CTAB Car RRR Abdomen is soft and non tender Erythematous maculopapular rash on trunk and legs consistent with PUPPPs   IMPRESSION IUP at 30 w 6 days PPROM since June 17 Uterine Septum PUPPPS  PLAN: Continue bedrest Status post antibiotics and steroids Placenta is partially implanted on septum she is at increased risk for abruption - discussed with patient  Check position if labor occurs and would administer magnesium for neuroprotection Continue medrol dose pack for PUPPPS

## 2015-06-17 LAB — TYPE AND SCREEN
ABO/RH(D): O POS
Antibody Screen: NEGATIVE

## 2015-06-17 NOTE — Progress Notes (Signed)
Pt reports feeling only "one" ctx.

## 2015-06-17 NOTE — Progress Notes (Signed)
Patient is doing well.  Leaking small amount BP 97/64 mmHg  Pulse 96  Temp(Src) 98 F (36.7 C) (Oral)  Resp 18  Ht 5\' 7"  (1.702 m)  Wt 77.429 kg (170 lb 11.2 oz)  BMI 26.73 kg/m2  SpO2 100%  General alert and oriented Lung CTAB Car RRR Abdomen is soft and non tender Erythematous maculopapular rash on trunk and legs consistent with PUPPPs   IMPRESSION IUP at 31 weeks PPROM since June 17 Uterine Septum PUPPPS  PLAN: Continue bedrest Status post antibiotics and steroids Placenta is partially implanted on septum she is at increased risk for abruption - discussed with patient  Check position if labor occurs and would administer magnesium for neuroprotection Continue medrol dose pack for PUPPPS

## 2015-06-18 NOTE — Progress Notes (Signed)
Pt states "I only felt 2 ctxs and this baby is wild and doing flips."

## 2015-06-18 NOTE — Progress Notes (Signed)
Pt off unit to attend family gathering.

## 2015-06-18 NOTE — Progress Notes (Signed)
Patient is doing well.  Leaking small amount Rash is improving BP 86/48 mmHg  Pulse 80  Temp(Src) 98.2 F (36.8 C) (Oral)  Resp 20  Ht 5\' 7"  (1.702 m)  Wt 77.429 kg (170 lb 11.2 oz)  BMI 26.73 kg/m2  SpO2 100%  General alert and oriented Lung CTAB Car RRR Abdomen is soft and non tender Erythematous maculopapular rash now only on abdomen and lighter than yesterday   IMPRESSION IUP at 31 weeks 1 day PPROM since June 17 Uterine Septum PUPPPS  PLAN: Continue bedrest Status post antibiotics and steroids Placenta is partially implanted on septum she is at increased risk for abruption - discussed with patient  Check position if labor occurs and would administer magnesium for neuroprotection Continue medrol dose pack for PUPPPS  Plan growth ultrasound end of next week

## 2015-06-19 NOTE — Progress Notes (Signed)
S:  Patient is resting without any complaints.  O:  BP 112/60 mmHg  Pulse 82  Temp(Src) 98.9 F (37.2 C) (Oral)  Resp 20  Ht  (1.702 m)  Wt 77.429 kg (170 lb 11.2 oz)  BMI 26.73 kg/m2  SpO2 100% Abdomen is soft and non tender  IMPRESSION: IUP at 31 w 2 days PPROM Uterine Septum PUPPPS  PLAN: Patient is clinically stable Status post antibiotics and steroids Plan on growth ultrasound end of this week Completed medrol dose pack  No evidence of chorioamnionitis

## 2015-06-20 LAB — TYPE AND SCREEN
ABO/RH(D): O POS
Antibody Screen: NEGATIVE

## 2015-06-20 NOTE — Progress Notes (Signed)
348w3d  S// PUPPP rash better, mild cramping  O// BP 99/62 mmHg  Pulse 85  Temp(Src) 98.5 F (36.9 C) (Oral)  Resp 18  Ht 5\' 7"  (1.702 m)  Wt 170 lb 11.2 oz (77.429 kg)  BMI 26.73 kg/m2  SpO2 99%  stablr FHR  A+P// 4648w3d, PPROM, cont obsv

## 2015-06-21 NOTE — Progress Notes (Signed)
Patient ID: Anna Brewer, female   DOB: 1986-07-14, 29 y.o.   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

## 2015-06-22 NOTE — Progress Notes (Signed)
Scant leakage of fluid. Occ CTX. Active FM.   VSS. AF. Gen: A&O x 3 Abd: soft, NT Ext: no c/c/e FHT Cat I  28yo G1 at 336w5d with PPROM, shortened CL -s/p BMZ and latency abx -Monitor for s/sx of chorio -MFM u/s tomorrow  Mitchel HonourMegan Kameren Baade, DO

## 2015-06-23 ENCOUNTER — Inpatient Hospital Stay (HOSPITAL_COMMUNITY): Payer: 59

## 2015-06-23 DIAGNOSIS — O42913 Preterm premature rupture of membranes, unspecified as to length of time between rupture and onset of labor, third trimester: Principal | ICD-10-CM | POA: Diagnosis present

## 2015-06-23 DIAGNOSIS — O9952 Diseases of the respiratory system complicating childbirth: Secondary | ICD-10-CM | POA: Diagnosis present

## 2015-06-23 DIAGNOSIS — J45909 Unspecified asthma, uncomplicated: Secondary | ICD-10-CM | POA: Diagnosis present

## 2015-06-23 DIAGNOSIS — O34593 Maternal care for other abnormalities of gravid uterus, third trimester: Secondary | ICD-10-CM | POA: Diagnosis present

## 2015-06-23 DIAGNOSIS — Z3A32 32 weeks gestation of pregnancy: Secondary | ICD-10-CM | POA: Diagnosis present

## 2015-06-23 DIAGNOSIS — Q512 Other doubling of uterus: Secondary | ICD-10-CM

## 2015-06-23 LAB — TYPE AND SCREEN
ABO/RH(D): O POS
Antibody Screen: NEGATIVE

## 2015-06-23 IMAGING — US US OB FOLLOW-UP
1 series · 12 of 28 positions shown · non-contrast
Comparison: none

[Series 1: us ob follow-up · 12 of 41 slices shown]
[im 2/41]
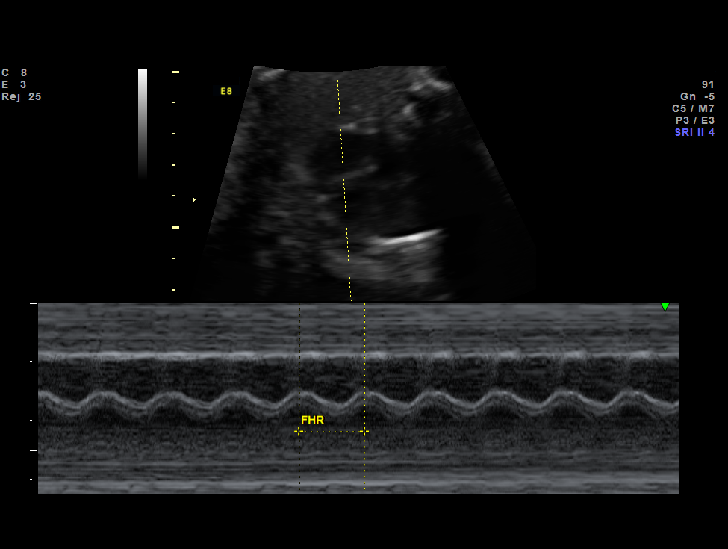
[im 5/41]
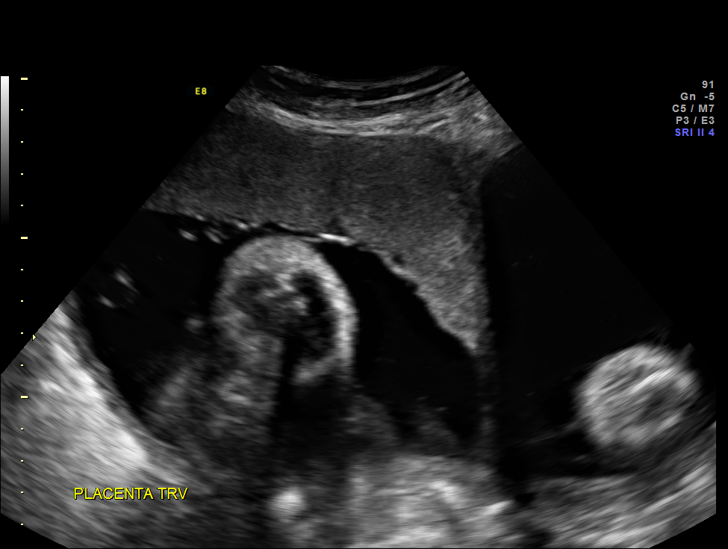
[im 8/41]
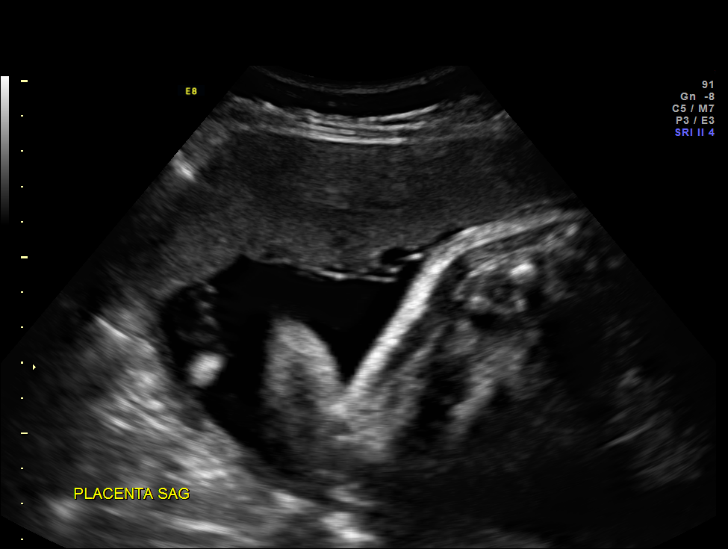
[im 12/41]
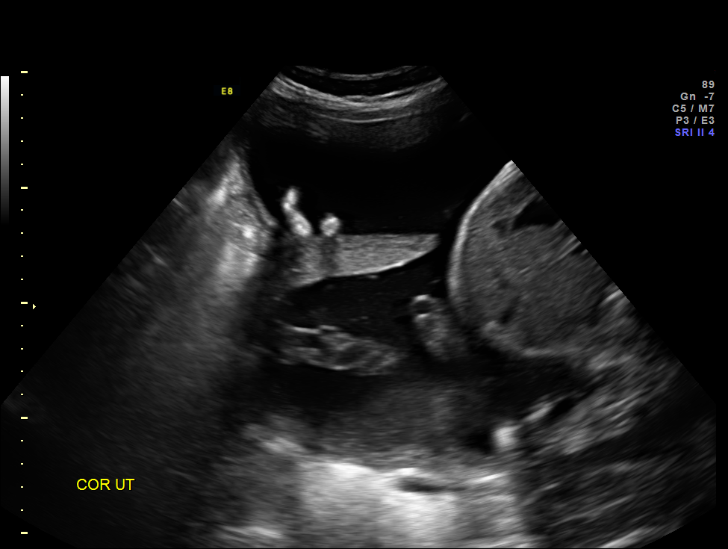
[im 15/41]
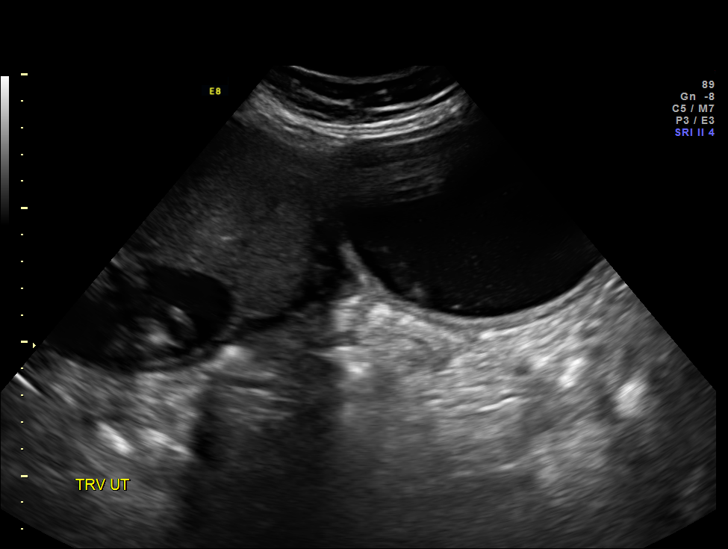
[im 18/41]
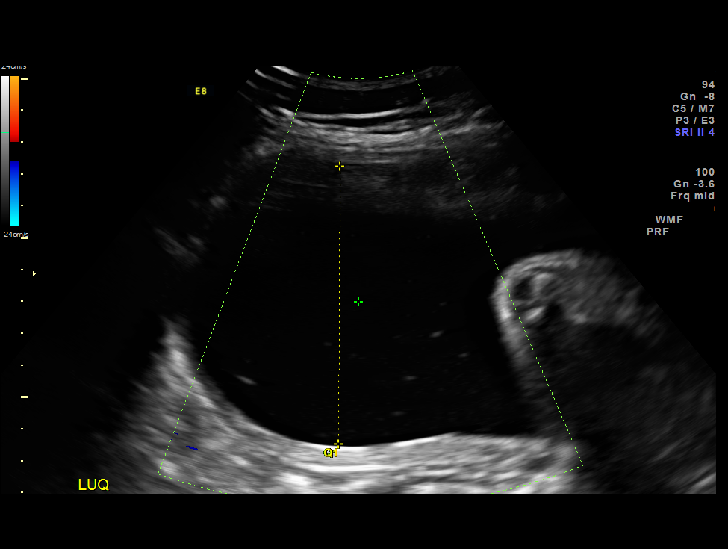
[im 23/41]
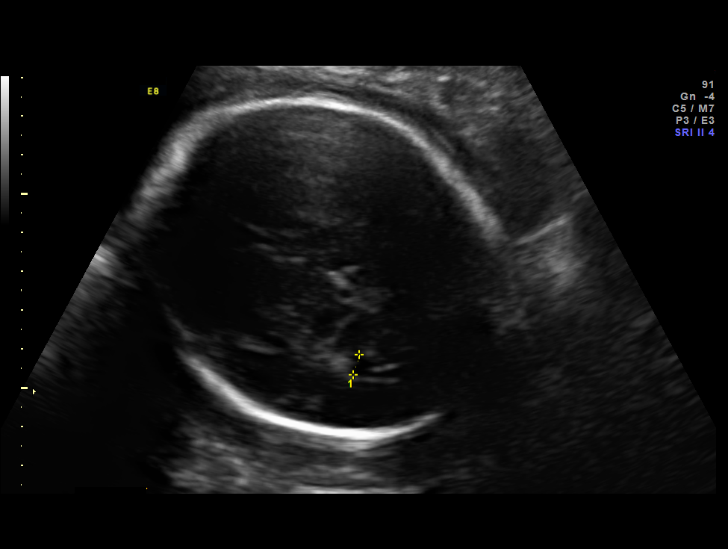
[im 26/41]
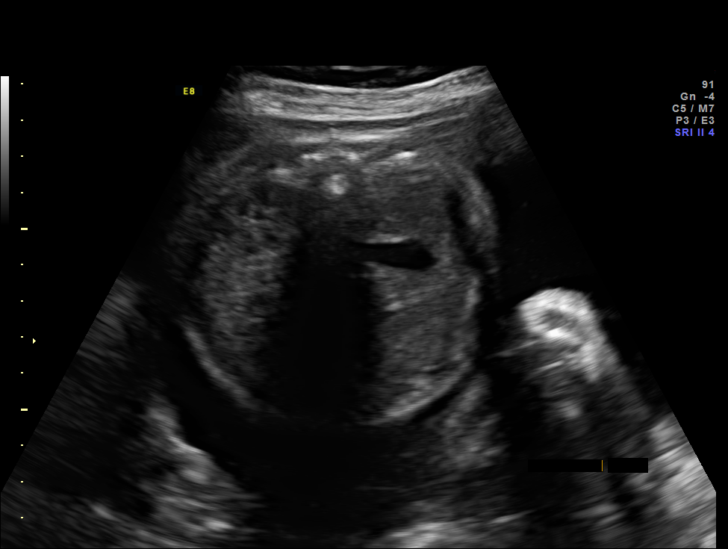
[im 29/41]
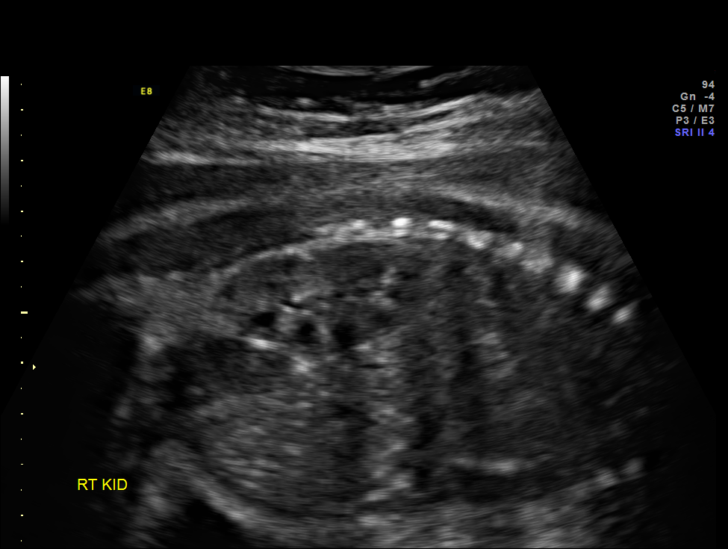
[im 33/41]
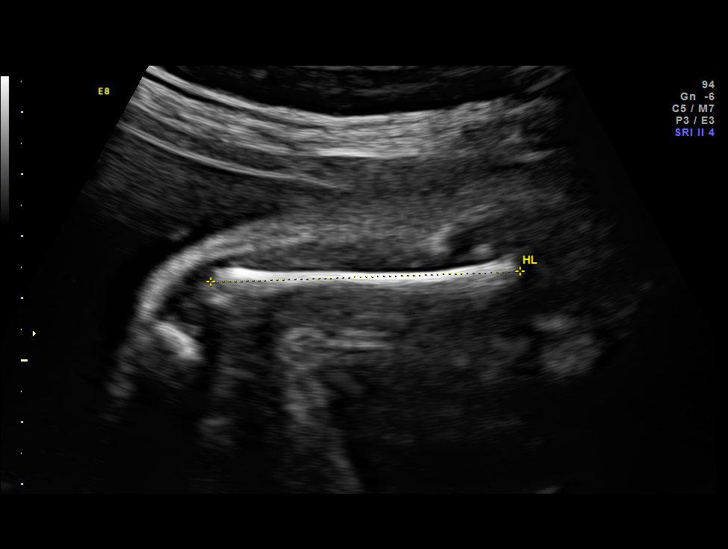
[im 36/41]
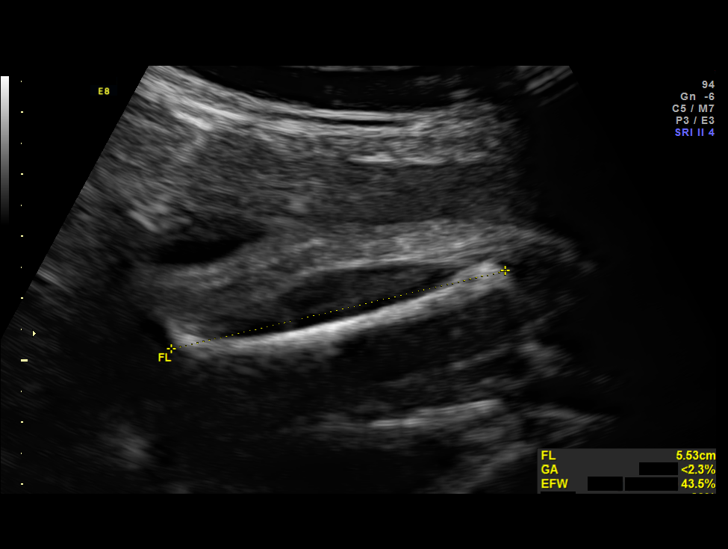
[im 39/41]
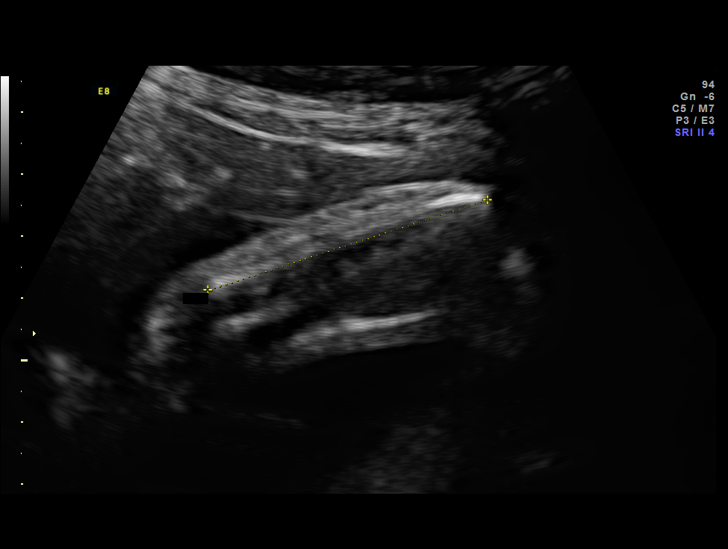

[12 of 28 positions shown; findings below may reference images not displayed]

OBSTETRICS REPORT
(Signed Final 06/23/2015 [DATE])

Service(s) Provided

US OB FOLLOW UP                                       76816.1
Indications

31 weeks gestation of pregnancy
Polyhydramnios, third trimester, antepartum
condition or complication, unspecified fetus
Premature rupture of membranes - leaking fluid
(+amnisure)
Uterine abnormality during pregnancy (mullerian
defect)
Cervical shortening, 3rd (per outside scan)
Medical complication of pregnancy (PUPPPS)
BMZ [DATE], [DATE]
Fetal Evaluation

Num Of Fetuses:    1
Fetal Heart Rate:  153                          bpm
Cardiac Activity:  Observed
Presentation:      Cephalic
Placenta:          Anterior, above cervical os
(RT horn)
P. Cord Insertion: Previously seen as normal

Amniotic Fluid
AFI FV:      Subjectively within normal limits
AFI Sum:     26.26   cm       97  %Tile      Larg Pckt:   8.72  cm
RUQ:   8.72    cm   RLQ:    5.17   cm    LUQ:   6.38    cm   LLQ:    5.99   cm
Biometry

BPD:     82.4  mm     G. Age:  33w 1d                CI:          82.4  70 - 86
OFD:      100  mm                                    FL/HC:       18.9  19.1 -
21.3
HC:     292.2  mm     G. Age:  32w 2d       23  %    HC/AC:       1.05  0.96 -
1.17
AC:     278.7  mm     G. Age:  32w 0d       50  %    FL/BPD:      66.9  71 - 87
FL:      55.1  mm     G. Age:  29w 0d       < 3 %    FL/AC:       19.8  20 - 24
HUM:     50.3  mm     G. Age:  29w 3d       < 5 %
ULN:     47.4  mm     G. Age:  30w 1d       10  %
TIB:       49  mm     G. Age:  29w 3d        8  %
RAD:     41.2  mm     G. Age:  28w 4d       30  %
FIB:     47.2  mm     G. Age:  29w 1d       38  %
Est. FW:    2329  gm     3 lb 12 oz     43  %
Gestational Age

U/S Today:     31w 4d                                        EDD:   08/21/15
Best:          31w 6d     Det. By:  Early Ultrasound         EDD:   08/19/15
(01/09/15)
Anatomy

Cranium:          Appears normal         Aortic Arch:      Previously seen
Fetal Cavum:      Appears normal         Ductal Arch:      Previously seen
Ventricles:       Appears normal         Diaphragm:        Previously seen
Choroid Plexus:   Previously seen        Stomach:          Appears normal, left
sided
Cerebellum:       Previously seen        Abdomen:          Appears normal
Posterior Fossa:  Previously seen        Abdominal Wall:   Previously seen
Nuchal Fold:      Not applicable (>20    Cord Vessels:     Previously seen
wks GA)
Face:             Orbits and profile     Kidneys:          Appear normal
previously seen
Lips:             Previously seen        Bladder:          Appears normal
Heart:            Appears normal         Spine:            Previously seen
(4CH, axis, and
situs)
RVOT:             Previously seen        Lower             Previously seen
Extremities:
LVOT:             Previously seen        Upper             Previously seen
Extremities:

Other:  Parents do not wish to know sex of fetus. Heels and 5th digit
previously visualized. Nasal bone previously visualized. Technically
difficult due to advanced GA and fetal position.
Cervix Uterus Adnexa

Cervix:       Not visualized (advanced GA >75wks)

Adnexa:     No abnormality visualized.
Impression

SIUP at 31+6 weeks
Normal interval anatomy; anatomic survey complete
High normal amniotic fluid volume
Appropriate interval growth with EFW at the 43rd %tile
Recommendations

Follow-up as clinically indicated

questions or concerns.

## 2015-06-23 NOTE — Progress Notes (Signed)
Dr. Renaldo FiddlerAdkins informed of pt concerns, U/S report reviewed with MD.  MD asks RN to reassure pt U/S WNL and she will talk to her in depth tomorrow regarding any questions/concerns she may have.

## 2015-06-23 NOTE — Progress Notes (Signed)
Scant leakage of fluid. Occ CTX. Active FM.   VSS. AF. Gen: A&O x 3 Abd: soft, NT Ext: no c/c/e FHT Cat I  28yo G1 at 7983w6d with PPROM, shortened CL -s/p BMZ and latency abx -Monitor for s/sx of chorio -MFM u/s today

## 2015-06-23 NOTE — Progress Notes (Signed)
Dr. Renaldo FiddlerAdkins informed of pt status.  MD told EFM removed after 2 hrs of monitoring & ctxs not progressively getting stronger or more frequent.  MD agreed & instructed RN to inform pt to notify RN if ctxs get stronger or more frequent.

## 2015-06-24 NOTE — Progress Notes (Signed)
Pt denies c/o.  Leaks small amt fluid. No vb or ctx.  + FM  AF, VSS Abd - gravid, NT Ext - SCD in place  A/P:  PPROM, 32 weeks S/p BMZ/Abx US with appropriate interval growth, AFI 97%' Continue inpt mngt

## 2015-06-25 NOTE — Progress Notes (Signed)
Pt denies c/o. Leaks small amt fluid. No vb. + FM.  Less ctx overnight  AF, VSS Abd - gravid, NT Ext - SCD in place  A/P: PPROM, 32+1 weeks S/p BMZ/Abx US with appropriate interval growth (43%), AFI 97%' Continue inpt mngt

## 2015-06-26 ENCOUNTER — Encounter (HOSPITAL_COMMUNITY): Payer: Self-pay | Admitting: *Deleted

## 2015-06-26 ENCOUNTER — Encounter (HOSPITAL_COMMUNITY): Payer: Self-pay | Admitting: Anesthesiology

## 2015-06-26 ENCOUNTER — Inpatient Hospital Stay (HOSPITAL_COMMUNITY): Payer: 59 | Admitting: Anesthesiology

## 2015-06-26 DIAGNOSIS — Z3490 Encounter for supervision of normal pregnancy, unspecified, unspecified trimester: Secondary | ICD-10-CM

## 2015-06-26 LAB — TYPE AND SCREEN
ABO/RH(D): O POS
Antibody Screen: NEGATIVE

## 2015-06-26 MED ORDER — OXYTOCIN 40 UNITS IN LACTATED RINGERS INFUSION - SIMPLE MED: 62.5000 mL/h | INTRAVENOUS | Status: DC

## 2015-06-26 MED ORDER — ONDANSETRON HCL 4 MG/2ML IJ SOLN: 4.0000 mg | Freq: Four times a day (QID) | INTRAMUSCULAR | Status: DC | PRN

## 2015-06-26 MED ORDER — ZOLPIDEM TARTRATE 5 MG PO TABS: 5.0000 mg | ORAL_TABLET | Freq: Every evening | ORAL | Status: DC | PRN

## 2015-06-26 MED ORDER — DIBUCAINE 1 % RE OINT: 1.0000 "application " | TOPICAL_OINTMENT | RECTAL | Status: DC | PRN

## 2015-06-26 MED ORDER — DIPHENHYDRAMINE HCL 25 MG PO CAPS: 25.0000 mg | ORAL_CAPSULE | Freq: Four times a day (QID) | ORAL | Status: DC | PRN

## 2015-06-26 MED ORDER — MEASLES, MUMPS & RUBELLA VAC ~~LOC~~ INJ: 0.5000 mL | INJECTION | Freq: Once | SUBCUTANEOUS | Status: DC

## 2015-06-26 MED ORDER — BISACODYL 10 MG RE SUPP: 10.0000 mg | Freq: Every day | RECTAL | Status: DC | PRN

## 2015-06-26 MED ORDER — ONDANSETRON HCL 4 MG/2ML IJ SOLN: 4.0000 mg | INTRAMUSCULAR | Status: DC | PRN

## 2015-06-26 MED ORDER — ACETAMINOPHEN 325 MG PO TABS: 650.0000 mg | ORAL_TABLET | ORAL | Status: DC | PRN

## 2015-06-26 MED ORDER — PRENATAL MULTIVITAMIN CH
1.0000 | ORAL_TABLET | Freq: Every day | ORAL | Status: DC
Start: 2015-06-26 — End: 2015-06-28
  Administered 2015-06-26 – 2015-06-28 (×3): 1 via ORAL
  Filled 2015-06-26 (×3): qty 1

## 2015-06-26 MED ORDER — WITCH HAZEL-GLYCERIN EX PADS: 1.0000 "application " | MEDICATED_PAD | CUTANEOUS | Status: DC | PRN

## 2015-06-26 MED ORDER — ONDANSETRON HCL 4 MG PO TABS
4.0000 mg | ORAL_TABLET | ORAL | Status: DC | PRN
  Administered 2015-06-26: 4 mg via ORAL
  Filled 2015-06-26: qty 1

## 2015-06-26 MED ORDER — OXYTOCIN 40 UNITS IN LACTATED RINGERS INFUSION - SIMPLE MED
INTRAVENOUS | Status: AC
  Filled 2015-06-26: qty 1000

## 2015-06-26 MED ORDER — LACTATED RINGERS IV SOLN: 500.0000 mL | INTRAVENOUS | Status: DC | PRN

## 2015-06-26 MED ORDER — DIPHENHYDRAMINE HCL 50 MG/ML IJ SOLN: 12.5000 mg | INTRAMUSCULAR | Status: DC | PRN

## 2015-06-26 MED ORDER — OXYCODONE-ACETAMINOPHEN 5-325 MG PO TABS: 2.0000 | ORAL_TABLET | ORAL | Status: DC | PRN

## 2015-06-26 MED ORDER — BUTORPHANOL TARTRATE 1 MG/ML IJ SOLN
1.0000 mg | Freq: Once | INTRAMUSCULAR | Status: AC
  Administered 2015-06-26: 1 mg via INTRAVENOUS
  Filled 2015-06-26: qty 1

## 2015-06-26 MED ORDER — IBUPROFEN 600 MG PO TABS
600.0000 mg | ORAL_TABLET | Freq: Four times a day (QID) | ORAL | Status: DC
  Administered 2015-06-26 – 2015-06-28 (×9): 600 mg via ORAL
  Filled 2015-06-26 (×10): qty 1

## 2015-06-26 MED ORDER — MAGNESIUM SULFATE 4 GM/100ML IV SOLN: 4.0000 g | Freq: Once | INTRAVENOUS | Status: DC

## 2015-06-26 MED ORDER — LACTATED RINGERS IV SOLN: INTRAVENOUS | Status: DC

## 2015-06-26 MED ORDER — SENNOSIDES-DOCUSATE SODIUM 8.6-50 MG PO TABS
2.0000 | ORAL_TABLET | ORAL | Status: DC
  Administered 2015-06-26 – 2015-06-27 (×2): 2 via ORAL
  Filled 2015-06-26 (×2): qty 2

## 2015-06-26 MED ORDER — ALBUTEROL SULFATE HFA 108 (90 BASE) MCG/ACT IN AERS: 1.0000 | INHALATION_SPRAY | Freq: Four times a day (QID) | RESPIRATORY_TRACT | Status: DC | PRN

## 2015-06-26 MED ORDER — CITRIC ACID-SODIUM CITRATE 334-500 MG/5ML PO SOLN
30.0000 mL | ORAL | Status: DC | PRN
Start: 2015-06-26 — End: 2015-06-26

## 2015-06-26 MED ORDER — EPHEDRINE 5 MG/ML INJ
10.0000 mg | INTRAVENOUS | Status: DC | PRN
  Filled 2015-06-26: qty 2

## 2015-06-26 MED ORDER — BENZOCAINE-MENTHOL 20-0.5 % EX AERO
1.0000 "application " | INHALATION_SPRAY | CUTANEOUS | Status: DC | PRN
  Administered 2015-06-26: 1 via TOPICAL
  Filled 2015-06-26: qty 56

## 2015-06-26 MED ORDER — LANOLIN HYDROUS EX OINT: TOPICAL_OINTMENT | CUTANEOUS | Status: DC | PRN

## 2015-06-26 MED ORDER — FENTANYL 2.5 MCG/ML BUPIVACAINE 1/10 % EPIDURAL INFUSION (WH - ANES)
14.0000 mL/h | INTRAMUSCULAR | Status: DC | PRN
  Administered 2015-06-26 (×2): 14 mL/h via EPIDURAL
  Filled 2015-06-26: qty 125

## 2015-06-26 MED ORDER — OXYTOCIN BOLUS FROM INFUSION: 500.0000 mL | INTRAVENOUS | Status: DC

## 2015-06-26 MED ORDER — EPHEDRINE 5 MG/ML INJ: 10.0000 mg | INTRAVENOUS | Status: DC | PRN

## 2015-06-26 MED ORDER — TETANUS-DIPHTH-ACELL PERTUSSIS 5-2.5-18.5 LF-MCG/0.5 IM SUSP: 0.5000 mL | Freq: Once | INTRAMUSCULAR | Status: DC

## 2015-06-26 MED ORDER — MAGNESIUM SULFATE BOLUS VIA INFUSION
4.0000 g | Freq: Once | INTRAVENOUS | Status: AC
  Administered 2015-06-26: 4 g via INTRAVENOUS
  Filled 2015-06-26 (×2): qty 500

## 2015-06-26 MED ORDER — PHENYLEPHRINE 40 MCG/ML (10ML) SYRINGE FOR IV PUSH (FOR BLOOD PRESSURE SUPPORT)
80.0000 ug | PREFILLED_SYRINGE | INTRAVENOUS | Status: DC | PRN
  Filled 2015-06-26: qty 2
  Filled 2015-06-26: qty 20

## 2015-06-26 MED ORDER — FLEET ENEMA 7-19 GM/118ML RE ENEM: 1.0000 | ENEMA | Freq: Every day | RECTAL | Status: DC | PRN

## 2015-06-26 MED ORDER — OXYCODONE-ACETAMINOPHEN 5-325 MG PO TABS: 1.0000 | ORAL_TABLET | ORAL | Status: DC | PRN

## 2015-06-26 MED ORDER — ALBUTEROL SULFATE (2.5 MG/3ML) 0.083% IN NEBU: 2.5000 mg | INHALATION_SOLUTION | Freq: Four times a day (QID) | RESPIRATORY_TRACT | Status: DC | PRN

## 2015-06-26 MED ORDER — LIDOCAINE HCL (PF) 1 % IJ SOLN
INTRAMUSCULAR | Status: DC | PRN
  Administered 2015-06-26 (×2): 4 mL

## 2015-06-26 MED ORDER — FLEET ENEMA 7-19 GM/118ML RE ENEM: 1.0000 | ENEMA | RECTAL | Status: DC | PRN

## 2015-06-26 MED ORDER — LIDOCAINE HCL (PF) 1 % IJ SOLN
30.0000 mL | INTRAMUSCULAR | Status: DC | PRN
  Filled 2015-06-26: qty 30

## 2015-06-26 MED ORDER — MEDROXYPROGESTERONE ACETATE 150 MG/ML IM SUSP: 150.0000 mg | INTRAMUSCULAR | Status: DC | PRN

## 2015-06-26 MED ORDER — SIMETHICONE 80 MG PO CHEW: 80.0000 mg | CHEWABLE_TABLET | ORAL | Status: DC | PRN

## 2015-06-26 MED ORDER — OXYCODONE-ACETAMINOPHEN 5-325 MG PO TABS
1.0000 | ORAL_TABLET | ORAL | Status: DC | PRN
  Administered 2015-06-27 (×2): 1 via ORAL
  Filled 2015-06-26 (×2): qty 1

## 2015-06-26 MED ORDER — FENTANYL 2.5 MCG/ML BUPIVACAINE 1/10 % EPIDURAL INFUSION (WH - ANES): 14.0000 mL/h | INTRAMUSCULAR | Status: DC | PRN

## 2015-06-26 MED ORDER — PHENYLEPHRINE 40 MCG/ML (10ML) SYRINGE FOR IV PUSH (FOR BLOOD PRESSURE SUPPORT): 80.0000 ug | PREFILLED_SYRINGE | INTRAVENOUS | Status: DC | PRN

## 2015-06-26 NOTE — Anesthesia Procedure Notes (Signed)
Epidural Patient location during procedure: OB  Staffing Anesthesiologist: Ambermarie Honeyman Performed by: anesthesiologist   Preanesthetic Checklist Completed: patient identified, site marked, surgical consent, pre-op evaluation, timeout performed, IV checked, risks and benefits discussed and monitors and equipment checked  Epidural Patient position: sitting Prep: site prepped and draped and DuraPrep Patient monitoring: continuous pulse ox and blood pressure Approach: midline Location: L3-L4 Injection technique: LOR saline  Needle:  Needle type: Tuohy  Needle gauge: 17 G Needle length: 9 cm and 9 Needle insertion depth: 6 cm Catheter type: closed end flexible Catheter size: 19 Gauge Catheter at skin depth: 10 cm Test dose: negative  Assessment Events: blood not aspirated, injection not painful, no injection resistance, negative IV test and no paresthesia  Additional Notes Patient identified. Risks/Benefits/Options discussed with patient including but not limited to bleeding, infection, nerve damage, paralysis, failed block, incomplete pain control, headache, blood pressure changes, nausea, vomiting, reactions to medication both or allergic, itching and postpartum back pain. Confirmed with bedside nurse the patient's most recent platelet count. Confirmed with patient that they are not currently taking any anticoagulation, have any bleeding history or any family history of bleeding disorders. Patient expressed understanding and wished to proceed. All questions were answered. Sterile technique was used throughout the entire procedure. Please see nursing notes for vital signs. Test dose was given through epidural catheter and negative prior to continuing to dose epidural or start infusion. Warning signs of high block given to the patient including shortness of breath, tingling/numbness in hands, complete motor block, or any concerning symptoms with instructions to call for help. Patient was  given instructions on fall risk and not to get out of bed. All questions and concerns addressed with instructions to call with any issues or inadequate analgesia.    

## 2015-06-26 NOTE — Progress Notes (Signed)
Patient started laboring this am.  Was 5 cm. Dr. Renaldo FiddlerAdkins notified and transferred to L and D.   FHR is category 1 Cervix is BBOW  VERTEX Anterior lip  IMPRESSION: IUP at 32 w 2 days PPROM Uterine Septum  PLAN: Anticipate NSVD Magnesium bolus Epidural AROM after epidural

## 2015-06-26 NOTE — Progress Notes (Signed)
Dr. Renaldo FiddlerAdkins notified of change in pt contraction pattern. Order to give stadol 1mg  IV once IV access is obtained.

## 2015-06-26 NOTE — Progress Notes (Signed)
Patient contracting 3-4 min and feeling some of them. Went in with RROB to start IV patient very anxious and refused to get IV at this time. Explained the importance of IV if in labor. Patient & husband understands risks. Will observe her and monitor for 15 min and will revisit the IV conversation.

## 2015-06-26 NOTE — Lactation Note (Signed)
This note was copied from the chart of Boy Manus RuddSara Solimine. Lactation Consultation Note  Patient Name: Boy Manus RuddSara Delo WUJWJ'XToday's Date: 06/26/2015 Reason for consult: Initial assessment;NICU baby  NICU baby 4 hours old, 1717w2d. Mom using DEBP when this LC entered room. Enc mom to sleep at night and then pump 8 times/day for 15 minutes. Enc mom to increase pumping times in the morning. Assisted mom to hand express with a pinpoint dot of EBM visible at each nipple. Enc parents to take EBM to NICU for baby, or to give to bedside RN to refrigerate if not going to NICU after pumping. Mom given NICU booklet with review. Mom aware of OP/BFSG, community resources, and [facility]. Enc mom to offer STS/Kangaroo care and nuzzling at breast/latching and nursing as able. Enc parents to call insurance company about getting DEBP.  Maternal Data Has patient been taught Hand Expression?: Yes Does the patient have breastfeeding experience prior to this delivery?: No  Feeding    LATCH Score/Interventions                      Lactation Tools Discussed/Used WIC Program: No Pump Review: Setup, frequency, and cleaning;Milk Storage Initiated by:: Bedside RN. Date initiated:: 06/26/15   Consult Status Consult Status: Follow-up Date: 06/27/15 Follow-up type: In-patient    Geralynn OchsWILLIARD, Samra Pesch 06/26/2015, 3:11 PM

## 2015-06-26 NOTE — Progress Notes (Signed)
4g Magnesium bolus started at 0802 and finished at 0822. Infusing discontinued per MD order.

## 2015-06-26 NOTE — Anesthesia Preprocedure Evaluation (Signed)
Anesthesia Evaluation  Patient identified by MRN, date of birth, ID band Patient awake    Reviewed: Allergy & Precautions, NPO status , Patient's Chart, lab work & pertinent test results  History of Anesthesia Complications Negative for: history of anesthetic complications  Airway Mallampati: II  TM Distance: >3 FB Neck ROM: Full    Dental no notable dental hx. (+) Dental Advisory Given   Pulmonary asthma ,  breath sounds clear to auscultation  Pulmonary exam normal       Cardiovascular negative cardio ROS Normal cardiovascular examRhythm:Regular Rate:Normal     Neuro/Psych  Headaches, negative psych ROS   GI/Hepatic Neg liver ROS, GERD-  Medicated and Controlled,  Endo/Other  negative endocrine ROS  Renal/GU negative Renal ROS  negative genitourinary   Musculoskeletal negative musculoskeletal ROS (+)   Abdominal   Peds negative pediatric ROS (+)  Hematology negative hematology ROS (+)   Anesthesia Other Findings   Reproductive/Obstetrics (+) Pregnancy                             Anesthesia Physical Anesthesia Plan  ASA: II  Anesthesia Plan: Epidural   Post-op Pain Management:    Induction:   Airway Management Planned:   Additional Equipment:   Intra-op Plan:   Post-operative Plan:   Informed Consent: I have reviewed the patients History and Physical, chart, labs and discussed the procedure including the risks, benefits and alternatives for the proposed anesthesia with the patient or authorized representative who has indicated his/her understanding and acceptance.   Dental advisory given  Plan Discussed with: Anesthesiologist  Anesthesia Plan Comments:         Anesthesia Quick Evaluation

## 2015-06-27 LAB — CBC
HCT: 35.9 % — ABNORMAL LOW (ref 36.0–46.0)
Hemoglobin: 12.1 g/dL (ref 12.0–15.0)
MCH: 31.2 pg (ref 26.0–34.0)
MCHC: 33.7 g/dL (ref 30.0–36.0)
MCV: 92.5 fL (ref 78.0–100.0)
Platelets: 185 10*3/uL (ref 150–400)
RBC: 3.88 MIL/uL (ref 3.87–5.11)
RDW: 13.4 % (ref 11.5–15.5)
WBC: 20.4 10*3/uL — ABNORMAL HIGH (ref 4.0–10.5)

## 2015-06-27 NOTE — Lactation Note (Signed)
This note was copied from the chart of Anna Manus RuddSara Buchanon. Lactation Consultation Note  Patient Name: Anna Brewer ZOXWR'UToday's Date: 06/27/2015 Reason for consult: Follow-up assessment;NICU baby NICU baby 3025 hours old. [facility] concerned that she is not seeing colostrum. Discussed normal progression of milk coming in and enc mom to continue pumping 8 times/day. Enc mom to massage breasts and hand express both before and after pumping. Also enc mom to relax and not watch breasts while pumping. Enc mom to have pictures and audio of baby on phone while pumping.   Maternal Data    Feeding Feeding Type: Formula  LATCH Score/Interventions                      Lactation Tools Discussed/Used     Consult Status Consult Status: Follow-up Date: 06/28/15 Follow-up type: In-patient    Geralynn OchsWILLIARD, Anna Brewer 06/27/2015, 12:07 PM

## 2015-06-27 NOTE — Progress Notes (Signed)
Sitz bath given per patient request, instructed on use, and patient verbalized understanding.

## 2015-06-27 NOTE — Anesthesia Postprocedure Evaluation (Signed)
Anesthesia Post Note  Patient: Anna Brewer  Procedure(s) Performed: * No procedures listed *  Anesthesia type: Epidural  Patient location: Women's Unit  Post pain: Pain level controlled  Post assessment: Post-op Vital signs reviewed  Last Vitals:  Filed Vitals:   06/27/15 0611  BP: 91/53  Pulse: 79  Temp: 36.7 C  Resp: 15    Post vital signs: Reviewed  Level of consciousness:alert  Complications: No apparent anesthesia complications

## 2015-06-27 NOTE — Progress Notes (Signed)
CSW attempted to meet with MOB to introduce myself, offer support and complete assessment due to NICU admission, but she was not in her room at this time.  CSW will attempt again at a later time.

## 2015-06-27 NOTE — Progress Notes (Signed)
Post Partum Day 1 Subjective: no complaints, up ad lib, voiding and tolerating PO.  Baby doing well in NICU  Objective: Blood pressure 91/53, pulse 79, temperature 98.1 F (36.7 C), temperature source Oral, resp. rate 15, height 5\' 7"  (1.702 m), weight 171 lb 3.2 oz (77.656 kg), SpO2 99 %, unknown if currently breastfeeding.  Physical Exam:  General: alert, cooperative and appears stated age Lochia: appropriate Uterine Fundus: firm Incision: healing well, no significant drainage, no dehiscence DVT Evaluation: No evidence of DVT seen on physical exam. Negative Homan's sign. No cords or calf tenderness.   Recent Labs  06/27/15 0535  HGB 12.1  HCT 35.9*    Assessment/Plan: Plan for discharge tomorrow   LOS: 26 days   Anna Brewer 06/27/2015, 10:10 AM

## 2015-06-28 MED ORDER — BENZOCAINE-MENTHOL 20-0.5 % EX AERO: 1.0000 "application " | INHALATION_SPRAY | Freq: Four times a day (QID) | CUTANEOUS | Status: DC | PRN

## 2015-06-28 MED ORDER — OXYCODONE-ACETAMINOPHEN 5-325 MG PO TABS: 1.0000 | ORAL_TABLET | Freq: Four times a day (QID) | ORAL | Status: DC | PRN

## 2015-06-28 MED ORDER — IBUPROFEN 600 MG PO TABS: 600.0000 mg | ORAL_TABLET | Freq: Four times a day (QID) | ORAL | Status: DC | PRN

## 2015-06-28 NOTE — Progress Notes (Signed)
Patient discharged home with husband... Discharge instructions reviewed with patient and she verbalized understanding... Condition stable... No equipment... Patient and husband to NICU to visit with baby and will go home form there.

## 2015-06-28 NOTE — Clinical Social Work Maternal (Signed)
CLINICAL SOCIAL WORK MATERNAL/CHILD NOTE  Patient Details  Name: Anna Brewer MRN: [id number] Date of Birth: 12/24/1985  Date:  06/28/2015  Clinical Social Worker Initiating Note:  Elliona Doddridge E. Garyn Arlotta, LCSW Date/ Time Initiated:  06/28/15/1130     Child's Name:  Anna Brewer   Legal Guardian:   (Parents: Anna Brewer and Anna Brewer)   Need for Interpreter:  None   Date of Referral:        Reason for Referral:   (No referral-NICU admission)   Referral Source:      Address:  [address] [place], [place] [address]  Phone number:  [phone]   Household Members:  Spouse   Natural Supports (not living in the home):  Immediate Family (MOB states her parents live an hour away.  Her father was present during assessment and when asked where they live, he replied, "wherever we need to be."  He states they live in [place], but may move closer to MOB.  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  CSW informed her of the availability of a list at NICU desk.  MOB reports having some baby items and plans to have a shower soon.)   Risk Factors/Current Problems:  None   Cognitive State:  Alert , Insightful , Goal Oriented , Linear Thinking    Mood/Affect:  Calm , Comfortable , Relaxed , Interested    CSW Assessment: CSW met with MOB and her father in MOB's third floor room/319 to introduce  myself, offer support and complete assessment due to NICU admission at 32.2 weeks.  Family was very pleasant and welcoming of CSW's visit.  MOB reports feeling well and seems to have a good understanding of baby's medical situation at this time.  She states he has been progressing so quickly that she has wondered if we are pushing him too much and fears that she may be "waiting for the other show to drop."  She states she knows there may be lots of "ups and downs."  CSW agrees and encouraged her to allow herself to celebrate the positives without expecting negatives to follow.   MOB reports being on Antenatal for 25 days for leaking fluid.  She states she feels like she coped well with that experience.  She is eager to go home today, but has mixed emotions since it means that she will be leaving baby.  CSW assisted her in processing these feelings.  She states she plans to ask for an estimated time frame from NICU staff.  CSW notes that it is normal to want to know when baby will be ready to come home, but cautioned her about having expectations surrounding baby's discharge and encouraged her to enjoy her time with her new baby regardless of the setting.  CSW acknowledges that the hospital is not as comfortable   and private as home, but that he will eventually get home, when he indicates that he is ready.  CSW encouraged family to remember that this experience is necessary and temporary.  MOB was very understanding about expectations and trying not to get let down throughout baby's hospitalization.  She seemed appreciative of the conversation.  CSW also informed MOB that she may get very little notice as to when baby is ready to discharge, further reinforcing how hard discharge date is to predict.  CSW informed MOB of the possibility of rooming in with baby the night before anticipated discharge and MOB is very interested in doing this.   CSW discussed common emotions often experienced in the first two weeks post  delivery, in addition to a premature birth and NICU admission.  CSW also provided education on perinatal mood disorders and the importance of talking with CSW and or her doctor if she has concerns about her emotional/mental health at any time.  MOB was engaged and attentive. MOB reports having a great support system, mainly in her spouse and parents.  Her father was with her today and appears very supportive.  He states he is anywhere his daughter needs him and quickly showed CSW a picture of his new grandson.  CSW explained ongoing support services offered by NICU CSW and gave contact information.  CSW has no social concerns at this time and thanked family for the visit.    CSW Plan/Description:  Patient/Family Education , Psychosocial Support and Ongoing Assessment of Needs    [place], LCSW 06/28/2015, 5:05 PM 

## 2015-06-28 NOTE — Discharge Summary (Signed)
Obstetric Discharge Summary Reason for Admission: PPROM Prenatal Procedures: ultrasound Intrapartum Procedures: spontaneous vaginal delivery Postpartum Procedures: none Complications-Operative and Postpartum: none HEMOGLOBIN  Date Value Ref Range Status  06/27/2015 12.1 12.0 - 15.0 g/dL Final   HCT  Date Value Ref Range Status  06/27/2015 35.9* 36.0 - 46.0 % Final    Physical Exam:  General: alert, cooperative and no distress Lochia: appropriate Uterine Fundus: firm Incision: healing well DVT Evaluation: No evidence of DVT seen on physical exam.  Discharge Diagnoses: PPROM delivered  Discharge Information: Date: 06/28/2015 Activity: pelvic rest Diet: routine Medications: PNV, Ibuprofen and Percocet Condition: stable Instructions: refer to practice specific booklet Discharge to: home   Newborn Data: Live born female  Birth Weight: 4 lb 1.3 oz (1850 g) APGAR: 8, 9  Home with mother.  Anna Brewer,Anna Brewer E 06/28/2015, 9:22 AM

## 2015-06-28 NOTE — Lactation Note (Signed)
This note was copied from the chart of Anna Brewer. Lactation Consultation Note  Patient Name: Anna Brewer BJYNW'GToday's Date: 06/28/2015 Reason for consult: Follow-up assessment;NICU baby NICU baby 1549 hours old. Mom reports that her colostrum is gradually increasing and her breasts are starting to feel "different." Enc mom to keep pumping and hand expressing. Enc mom to call for assistance when baby able to nuzzle and latch. Mom aware of OP/BFSG and LC phone line assistance after D/C. Mom given 2-week WH rental DEBP.  Maternal Data    Feeding Feeding Type: Formula Length of feed: 30 min  LATCH Score/Interventions                      Lactation Tools Discussed/Used     Consult Status Consult Status: PRN    Anna Brewer, Anna Brewer 06/28/2015, 11:39 AM

## 2015-06-28 NOTE — Progress Notes (Signed)
Post Partum Day 2 Subjective: up ad lib, voiding, tolerating PO and + flatus  Objective: Blood pressure 127/56, pulse 100, temperature 98.3 F (36.8 C), temperature source Oral, resp. rate 16, height 5\' 7"  (1.702 m), weight 171 lb 3.2 oz (77.656 kg), SpO2 98 %, unknown if currently breastfeeding.  Physical Exam:  General: alert, cooperative and no distress Lochia: appropriate Uterine Fundus: firm Incision: healing well DVT Evaluation: No evidence of DVT seen on physical exam.   Recent Labs  06/27/15 0535  HGB 12.1  HCT 35.9*    Assessment/Plan: Discharge home   LOS: 27 days   Lety Cullens II,Unnamed Hino E 06/28/2015, 9:19 AM

## 2015-07-03 ENCOUNTER — Ambulatory Visit: Payer: Self-pay

## 2015-07-03 NOTE — Lactation Note (Signed)
This note was copied from the chart of Anna Manus RuddSara Calder. Lactation Consultation Note  Patient Name: Anna Brewer ZOXWR'UToday's Date: 07/03/2015 Reason for consult: Follow-up assessmentwith this mom of a NICU baby, now 447 days old and 33 2/7 weeks CGA. Mom is having a root canal tomorrow, and wanted to be sure the local anestethic was fine with pumping EBM. I told mom it was, and she was relieved, Mom has a great milk supply, expressing up to 90 mls 8 times a day.    Maternal Data    Feeding Feeding Type: Breast Milk Length of feed: 60 min  LATCH Score/Interventions                      Lactation Tools Discussed/Used     Consult Status Consult Status: PRN Follow-up type: In-patient (NICU)    Alfred LevinsLee, Gredmarie Delange Anne 07/03/2015, 3:07 PM

## 2015-07-17 ENCOUNTER — Ambulatory Visit: Payer: Self-pay

## 2015-07-17 NOTE — Lactation Note (Signed)
This note was copied from the chart of [name]. Lactation Consultation Note  Assisted mom in the NICU with breastfeeding.  Discussed baby's early gestation and normal feeding expectations.  Explained to parents that most babies need to be closer to term to transfer a full feeding from breast.  Reassured that feedings should improve gradually and patience is necessary.  Mom is pumping and her supply is excellent.  She obtains 90-120 mls every 3 hours.  Baby placed in football hold and milk started dripping.  Baby has a small mouth and does not open wide.  Nipple placed in baby's mouth but no sucking elicited.  I suggested a nipple shield and mom agreeable.  Mom taught how to apply a 16 mm nipple shield.  Baby started active sucking once shield was applied.  Baby paced himself and tolerated the feeding well.  Plan to met with mom and baby tomorrow to do a pre and post weight to evaluate milk transfer.  Patient Name: Anna Brewer [id number] Date: 07/17/2015 Reason for consult: Follow-up assessment;NICU baby   Maternal Data    Feeding Feeding Type: Breast Fed Length of feed: 30 min  LATCH Score/Interventions Latch: Grasps breast easily, tongue down, lips flanged, rhythmical sucking. (WITH A 16 MM NIPPLE SHIELD) Intervention(s): Adjust position;Assist with latch;Breast massage;Breast compression  Audible Swallowing: Spontaneous and intermittent Intervention(s): Hand expression;Alternate breast massage  Type of Nipple: Everted at rest and after stimulation  Comfort (Breast/Nipple): Soft / non-tender     Hold (Positioning): Assistance needed to correctly position infant at breast and maintain latch. Intervention(s): Breastfeeding basics reviewed;Support Pillows;Position options  LATCH Score: 9  Lactation Tools Discussed/Used     Consult Status      Ave Filter 07/17/2015, 2:02 PM

## 2015-07-18 ENCOUNTER — Ambulatory Visit: Payer: Self-pay

## 2015-07-18 NOTE — Lactation Note (Signed)
This note was copied from the chart of Anna Brewer. Lactation Consultation Note  Follow up assist in NICU.  Baby is awake and rooting.  Mom positioned by in the cross cradle hold.  Baby latched easily using a 16 mm nipple shield.  Observed a 20 minute feeding.  Baby paces well and tolerated the feeding well.  Pre and post weight done and baby transferred 16 mls.  Reassured mom that amount is good for his gestational age.  Plan is to repeat a pre/post weight in 2-3 days.  Patient Name: Anna Selita Staiger [id number] Date: 07/18/2015     Maternal Data    Feeding Feeding Type: Breast Fed (lactation assisting mother this feeding) Length of feed: 20 min  LATCH Score/Interventions                      Lactation Tools Discussed/Used     Consult Status      Huston Foley 07/18/2015, 3:41 PM

## 2015-07-25 ENCOUNTER — Ambulatory Visit: Payer: Self-pay

## 2015-07-25 NOTE — Lactation Note (Signed)
This note was copied from the chart of Anna Brewer. Lactation Consultation Note  Patient Name: Anna Brewer [id number] Date: 07/25/2015 Reason for consult: Follow-up assessment;NICU baby NICU baby 8 weeks old, [redacted]w[redacted]d. Assisted mom to latch baby to left breast if football position using #16 NS. Baby sleepy at breast even though mom dripping colostrum in NS and baby stimulated with mom's finger on head and LC rubbing baby's hand. Re-fitted mom with a #20 NS and baby suckled in several bursts with good swallows noted. Baby still fussy when stimulated, but able to transfer some milk. Baby had been weighed prior to nursing, but baby very upset and was not still enough to get an accurate weight. Baby sleepy when weighed after nursing, and weight 30 grams less, so did not record inaccurate weights. Mom states that she will try to come in early in the morning and nurse baby with #20 NS. Enc mom to take NS with her and keep in her bag as her last NS is missing from baby's cart. Mom has plenty of breast milk that flows easily to the point that mom wears a pad on the opposite breast while baby nursing.   Discussed ways of moving baby away from NS after baby at home and nursing better. Discussed that it will be a gradual process and baby will need to be supplement and mom will need to continue post-pumping until she is sure baby nursing well, and transferring enough milk to soften breast and continue gaining weight. Enc mom to call for assistance as needed.   Maternal Data    Feeding Feeding Type: Breast Fed Nipple Type: Slow - flow Length of feed: 30 min  LATCH Score/Interventions Latch: Grasps breast easily, tongue down, lips flanged, rhythmical sucking. Intervention(s): Adjust position;Assist with latch  Audible Swallowing: A few with stimulation  Type of Nipple: Everted at rest and after stimulation  Comfort (Breast/Nipple): Soft / non-tender     Hold (Positioning): Assistance needed to  correctly position infant at breast and maintain latch. Intervention(s): Support Pillows;Skin to skin  LATCH Score: 8  Lactation Tools Discussed/Used Tools: Nipple Shields Nipple shield size: 20   Consult Status Consult Status: PRN    Nancy Nordmann, Onie Kasparek 07/25/2015, 3:12 PM

## 2016-08-22 ENCOUNTER — Encounter (HOSPITAL_BASED_OUTPATIENT_CLINIC_OR_DEPARTMENT_OTHER): Payer: Self-pay | Admitting: *Deleted

## 2016-08-22 NOTE — Progress Notes (Signed)
NPO AFTER MN.  ARRIVE AT 0945.  NEEDS HG AND URINE PREG.  MAY TAKE TYLENOL IF NEEDED W/ SIPS OF WATER AM DOS .

## 2016-08-22 NOTE — H&P (Signed)
Anna Brewer is a 30 y.o. female , originally referred to me by Mitchel HonourMegan Morris, for uterine septum and history of PPROM at [redacted] weeks gestation. 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 Fundus of the uterus appears flat. Patient would like to preserve her childbearing potential.  Pertinent Gynecological History: Menses: Normal Bleeding: Normal Contraception: none DES exposure: denies Blood transfusions: none Sexually transmitted diseases: no past history Previous GYN Procedures: None  Last mammogram: normal Last pap: normal  OB History: G1P1   Menstrual History: Menarche age: [age] No LMP recorded.    Past Medical History:  Diagnosis Date  . GERD (gastroesophageal reflux disease)   . Migraine   . Mild intermittent asthma   . Wears glasses                     Past Surgical History:  Procedure Laterality Date  . TONSILLECTOMY  2007  . WISDOM TOOTH EXTRACTION               Family History  Problem Relation Age of Onset  . Cancer Maternal Grandfather   . Cancer Paternal Grandfather    No hereditary disease.  No cancer of breast, ovary, uterus. No cutaneous leiomyomatosis or renal cell carcinoma.  Social History   Social History  . Marital status: Married    Spouse name: N/A  . Number of children: N/A  . Years of education: N/A   Occupational History  . Not on file.   Social History Main Topics  . Smoking status: Never Smoker  . Smokeless tobacco: Never Used  . Alcohol use No  . Drug use: No  . Sexual activity: Yes    Birth control/ protection: None   Other Topics Concern  . Not on file   Social History Narrative  . No narrative on file    No Known Allergies  No current facility-administered medications on file prior to encounter.    Current Outpatient Prescriptions on File Prior to Encounter  Medication Sig Dispense Refill  . albuterol (PROVENTIL HFA;VENTOLIN HFA) 108 (90 BASE) MCG/ACT inhaler  Inhale 1-2 puffs into the lungs every 6 (six) hours as needed for wheezing or shortness of breath.       Review of Systems  Constitutional: Negative.   HENT: Negative.   Eyes: Negative.   Respiratory: Negative.   Cardiovascular: Negative.   Gastrointestinal: Negative.   Genitourinary: Negative.   Musculoskeletal: Negative.   Skin: Negative.   Neurological: Negative.   Endo/Heme/Allergies: Negative.   Psychiatric/Behavioral: Negative.      Physical Exam  Ht 5\' 7"  (1.702 m)   Wt 68 kg (150 lb)   LMP 08/21/2016 (Exact Date)   Breastfeeding? Yes   BMI 23.49 kg/m  Constitutional: She is oriented to person, place, and time. She appears well-developed and well-nourished.  HENT:  Head: Normocephalic and atraumatic.  Nose: Nose normal.  Mouth/Throat: Oropharynx is clear and moist. No oropharyngeal exudate.  Eyes: Conjunctivae normal and EOM are normal. Pupils are equal, round, and reactive to light. No scleral icterus.  Neck: Normal range of motion. Neck supple. No tracheal deviation present. No thyromegaly present.  Cardiovascular: Normal rate.   Respiratory: Effort normal and breath sounds normal.  GI: Soft. Bowel sounds are normal. She exhibits no distension and no mass. There is no tenderness.  Lymphadenopathy:    She has no cervical adenopathy.  Neurological: She is alert and oriented to person, place,  and time. She has normal reflexes.  Skin: Skin is warm.  Psychiatric: She has a normal mood and affect. Her behavior is normal. Judgment and thought content normal.       Assessment/Plan:  Longitudinal uterine septum of 3 cm. PPROM at 28 weeks. Preoperative for hysteroscopy, excision of uterine septum. Benefits and risks of hysteroscopy were discussed with the patient and her family member again. All of patient's questions were answered.  She verbalized understanding.

## 2016-08-27 ENCOUNTER — Ambulatory Visit (HOSPITAL_BASED_OUTPATIENT_CLINIC_OR_DEPARTMENT_OTHER): Payer: 59 | Admitting: Anesthesiology

## 2016-08-27 ENCOUNTER — Encounter (HOSPITAL_BASED_OUTPATIENT_CLINIC_OR_DEPARTMENT_OTHER): Payer: Self-pay | Admitting: *Deleted

## 2016-08-27 ENCOUNTER — Ambulatory Visit (HOSPITAL_BASED_OUTPATIENT_CLINIC_OR_DEPARTMENT_OTHER)
Admission: RE | Admit: 2016-08-27 | Discharge: 2016-08-27 | Disposition: A | Discharge status: Home / Self Care | Payer: 59 | Source: Ambulatory Visit | Attending: Obstetrics and Gynecology | Admitting: Obstetrics and Gynecology

## 2016-08-27 ENCOUNTER — Encounter (HOSPITAL_BASED_OUTPATIENT_CLINIC_OR_DEPARTMENT_OTHER)
Admission: RE | Disposition: A | Discharge status: Home / Self Care | Payer: Self-pay | Source: Ambulatory Visit | Attending: Obstetrics and Gynecology

## 2016-08-27 DIAGNOSIS — Q512 Other doubling of uterus: Secondary | ICD-10-CM | POA: Diagnosis present

## 2016-08-27 DIAGNOSIS — Z8751 Personal history of pre-term labor: Secondary | ICD-10-CM | POA: Insufficient documentation

## 2016-08-27 HISTORY — PX: HYSTEROSCOPY: SHX211

## 2016-08-27 HISTORY — DX: Mild intermittent asthma, uncomplicated: J45.20

## 2016-08-27 HISTORY — DX: Presence of spectacles and contact lenses: Z97.3

## 2016-08-27 HISTORY — DX: Gastro-esophageal reflux disease without esophagitis: K21.9

## 2016-08-27 LAB — POCT HEMOGLOBIN-HEMACUE: HEMOGLOBIN: 13.1 g/dL (ref 12.0–15.0)

## 2016-08-27 LAB — TYPE AND SCREEN
ABO/RH(D): O POS
ANTIBODY SCREEN: NEGATIVE

## 2016-08-27 LAB — POCT PREGNANCY, URINE: PREG TEST UR: NEGATIVE

## 2016-08-27 LAB — ABO/RH: ABO/RH(D): O POS

## 2016-08-27 SURGERY — HYSTEROSCOPY
Anesthesia: General

## 2016-08-27 MED ORDER — DEXAMETHASONE SODIUM PHOSPHATE 10 MG/ML IJ SOLN
INTRAMUSCULAR | Status: AC
  Filled 2016-08-27: qty 1

## 2016-08-27 MED ORDER — KETOROLAC TROMETHAMINE 30 MG/ML IJ SOLN
INTRAMUSCULAR | Status: DC | PRN
  Administered 2016-08-27: 30 mg via INTRAVENOUS

## 2016-08-27 MED ORDER — FENTANYL CITRATE (PF) 100 MCG/2ML IJ SOLN
INTRAMUSCULAR | Status: DC | PRN
  Administered 2016-08-27: 25 ug via INTRAVENOUS
  Administered 2016-08-27: 50 ug via INTRAVENOUS

## 2016-08-27 MED ORDER — LIDOCAINE 2% (20 MG/ML) 5 ML SYRINGE
INTRAMUSCULAR | Status: AC
  Filled 2016-08-27: qty 5

## 2016-08-27 MED ORDER — ONDANSETRON HCL 4 MG/2ML IJ SOLN
INTRAMUSCULAR | Status: DC | PRN
  Administered 2016-08-27: 4 mg via INTRAVENOUS

## 2016-08-27 MED ORDER — ESTRADIOL 2 MG PO TABS: 3.0000 mg | ORAL_TABLET | Freq: Two times a day (BID) | ORAL | 0 refills | Status: DC

## 2016-08-27 MED ORDER — CEFAZOLIN SODIUM-DEXTROSE 2-4 GM/100ML-% IV SOLN
2.0000 g | INTRAVENOUS | Status: AC
  Administered 2016-08-27: 2 g via INTRAVENOUS
  Filled 2016-08-27: qty 100

## 2016-08-27 MED ORDER — OXYCODONE-ACETAMINOPHEN 5-325 MG PO TABS: 1.0000 | ORAL_TABLET | ORAL | 0 refills | Status: DC | PRN

## 2016-08-27 MED ORDER — VASOPRESSIN 20 UNIT/ML IV SOLN
INTRAVENOUS | Status: DC | PRN
  Administered 2016-08-27: 20 mL via INTRAMUSCULAR

## 2016-08-27 MED ORDER — LIDOCAINE HCL (CARDIAC) 20 MG/ML IV SOLN
INTRAVENOUS | Status: DC | PRN
  Administered 2016-08-27: 80 mg via INTRAVENOUS

## 2016-08-27 MED ORDER — MIDAZOLAM HCL 5 MG/5ML IJ SOLN
INTRAMUSCULAR | Status: DC | PRN
  Administered 2016-08-27: 1 mg via INTRAVENOUS

## 2016-08-27 MED ORDER — SODIUM CHLORIDE 0.9 % IR SOLN
Status: DC | PRN
  Administered 2016-08-27 (×2): 3000 mL

## 2016-08-27 MED ORDER — FENTANYL CITRATE (PF) 100 MCG/2ML IJ SOLN
25.0000 ug | INTRAMUSCULAR | Status: DC | PRN
  Filled 2016-08-27: qty 1

## 2016-08-27 MED ORDER — PROPOFOL 10 MG/ML IV BOLUS
INTRAVENOUS | Status: DC | PRN
  Administered 2016-08-27: 150 mg via INTRAVENOUS
  Administered 2016-08-27: 50 mg via INTRAVENOUS

## 2016-08-27 MED ORDER — DEXAMETHASONE SODIUM PHOSPHATE 4 MG/ML IJ SOLN
INTRAMUSCULAR | Status: DC | PRN
  Administered 2016-08-27: 10 mg via INTRAVENOUS

## 2016-08-27 MED ORDER — CEFAZOLIN SODIUM-DEXTROSE 2-4 GM/100ML-% IV SOLN
INTRAVENOUS | Status: AC
  Filled 2016-08-27: qty 100

## 2016-08-27 MED ORDER — FENTANYL CITRATE (PF) 100 MCG/2ML IJ SOLN
INTRAMUSCULAR | Status: AC
Start: 2016-08-27 — End: 2016-08-27
  Filled 2016-08-27: qty 2

## 2016-08-27 MED ORDER — PROPOFOL 10 MG/ML IV BOLUS
INTRAVENOUS | Status: AC
Start: 2016-08-27 — End: 2016-08-27
  Filled 2016-08-27: qty 20

## 2016-08-27 MED ORDER — ONDANSETRON HCL 4 MG/2ML IJ SOLN
INTRAMUSCULAR | Status: AC
  Filled 2016-08-27: qty 2

## 2016-08-27 MED ORDER — MIDAZOLAM HCL 2 MG/2ML IJ SOLN
INTRAMUSCULAR | Status: AC
  Filled 2016-08-27: qty 2

## 2016-08-27 MED ORDER — LACTATED RINGERS IV SOLN
INTRAVENOUS | Status: DC
  Administered 2016-08-27 (×2): via INTRAVENOUS
  Filled 2016-08-27: qty 1000

## 2016-08-27 MED ORDER — PROMETHAZINE HCL 25 MG/ML IJ SOLN
6.2500 mg | INTRAMUSCULAR | Status: DC | PRN
  Filled 2016-08-27: qty 1

## 2016-08-27 MED ORDER — MEDROXYPROGESTERONE ACETATE 10 MG PO TABS: 10.0000 mg | ORAL_TABLET | Freq: Every day | ORAL | 0 refills | Status: DC

## 2016-08-27 SURGICAL SUPPLY — 37 items
BIPOLAR CUTTING LOOP 21FR (ELECTRODE)
CANISTER SUCTION 2500CC (MISCELLANEOUS) ×3 IMPLANT
CATH HSG 5FRX28CM (CATHETERS) IMPLANT
CATH INTRA ACCESS BALLN (BALLOONS) IMPLANT
CATH ROBINSON RED A/P 16FR (CATHETERS) ×3 IMPLANT
CATH SALPINGOGRAPHY SELECT (BALLOONS) IMPLANT
CATH SSG INJECTION W/GUIDEWIRE (BALLOONS) IMPLANT
COVER BACK TABLE 60X90IN (DRAPES) ×3 IMPLANT
DRAPE LG THREE QUARTER DISP (DRAPES) ×6 IMPLANT
ELECT BIPOLAR POINTED 21FR (MISCELLANEOUS) ×3
ELECT REM PT RETURN 9FT ADLT (ELECTROSURGICAL) ×3
ELECTRODE LOOP CTNG BIPLR 21FR (MISCELLANEOUS) ×1 IMPLANT
ELECTRODE REM PT RTRN 9FT ADLT (ELECTROSURGICAL) ×1 IMPLANT
GLOVE BIO SURGEON STRL SZ8 (GLOVE) ×3 IMPLANT
GLOVE BIOGEL PI IND STRL 8.5 (GLOVE) ×1 IMPLANT
GLOVE BIOGEL PI INDICATOR 8.5 (GLOVE) ×2
GOWN STRL REUS W/ TWL XL LVL3 (GOWN DISPOSABLE) ×2 IMPLANT
GOWN STRL REUS W/TWL XL LVL3 (GOWN DISPOSABLE) ×4
IV NS IRRIG 3000ML ARTHROMATIC (IV SOLUTION) ×6 IMPLANT
KIT ROOM TURNOVER WOR (KITS) ×3 IMPLANT
LEGGING LITHOTOMY PAIR STRL (DRAPES) ×3 IMPLANT
LOOP CUTTING BIPOLAR 21FR (ELECTRODE) IMPLANT
MANIFOLD NEPTUNE II (INSTRUMENTS) IMPLANT
NEEDLE SPNL 22GX3.5 QUINCKE BK (NEEDLE) ×3 IMPLANT
PACK BASIN DAY SURGERY FS (CUSTOM PROCEDURE TRAY) ×3 IMPLANT
PAD OB MATERNITY 4.3X12.25 (PERSONAL CARE ITEMS) ×3 IMPLANT
SEPRAFILM MEMBRANE 5X6 (MISCELLANEOUS) ×3 IMPLANT
SET IRRIG Y TYPE TUR BLADDER L (SET/KITS/TRAYS/PACK) ×3 IMPLANT
SUT SILK 2 0 SH (SUTURE) IMPLANT
SUT SILK 3 0 PS 1 (SUTURE) IMPLANT
SYR 20CC LL (SYRINGE) ×3 IMPLANT
SYR 3ML 18GX1 1/2 (SYRINGE) ×3 IMPLANT
TOWEL OR 17X24 6PK STRL BLUE (TOWEL DISPOSABLE) ×6 IMPLANT
TRAY DSU PREP LF (CUSTOM PROCEDURE TRAY) ×3 IMPLANT
TUBE CONNECTING 12'X1/4 (SUCTIONS) ×1
TUBE CONNECTING 12X1/4 (SUCTIONS) ×2 IMPLANT
WATER STERILE IRR 500ML POUR (IV SOLUTION) ×3 IMPLANT

## 2016-08-27 NOTE — Anesthesia Preprocedure Evaluation (Signed)
Anesthesia Evaluation  Patient identified by MRN, date of birth, ID band Patient awake    Reviewed: Allergy & Precautions, H&P , NPO status , Patient's Chart, lab work & pertinent test results  History of Anesthesia Complications Negative for: history of anesthetic complications  Airway Mallampati: II  TM Distance: >3 FB Neck ROM: full    Dental no notable dental hx.    Pulmonary asthma ,    Pulmonary exam normal breath sounds clear to auscultation       Cardiovascular negative cardio ROS Normal cardiovascular exam Rhythm:regular Rate:Normal     Neuro/Psych  Headaches,    GI/Hepatic Neg liver ROS, GERD  ,  Endo/Other  negative endocrine ROS  Renal/GU negative Renal ROS     Musculoskeletal   Abdominal   Peds  Hematology negative hematology ROS (+)   Anesthesia Other Findings   Reproductive/Obstetrics negative OB ROS                             Anesthesia Physical Anesthesia Plan  ASA: II  Anesthesia Plan: General   Post-op Pain Management:    Induction: Intravenous  Airway Management Planned: LMA  Additional Equipment:   Intra-op Plan:   Post-operative Plan: Extubation in OR  Informed Consent: I have reviewed the patients History and Physical, chart, labs and discussed the procedure including the risks, benefits and alternatives for the proposed anesthesia with the patient or authorized representative who has indicated his/her understanding and acceptance.   Dental Advisory Given  Plan Discussed with: Anesthesiologist, CRNA and Surgeon  Anesthesia Plan Comments:         Anesthesia Quick Evaluation

## 2016-08-27 NOTE — Transfer of Care (Signed)
Immediate Anesthesia Transfer of Care Note  Patient: Anna Brewer  Procedure(s) Performed: Procedure(s) (LRB): HYSTEROSCOPY excision of uterine septum (N/A)  Patient Location: PACU  Anesthesia Type: General  Level of Consciousness: awake, sedated, patient cooperative and responds to stimulation  Airway & Oxygen Therapy: Patient Spontanous Breathing and Patient connected to [place] oxygen  Post-op Assessment: Report given to PACU RN, Post -op Vital signs reviewed and stable and Patient moving all extremities  Post vital signs: Reviewed and stable  Complications: No apparent anesthesia complications

## 2016-08-27 NOTE — Discharge Instructions (Signed)

## 2016-08-27 NOTE — Op Note (Signed)
OPERATIVE NOTE  Preoperative diagnosis: Partial uterine septum, prior preterm delivery  Postoperative diagnosis: Partial uterine septum, prior preterm delivery  Procedure: Hysteroscopy, incision of uterine septum, placement of intrauterine Seprafilm stent  Surgeon: Fermin SchwabYALCINKAYA,Jolinda Pinkstaff  Anesthesia: General  Complications: None  Estimated blood loss: Less than 20 mL  Specimen: None  Findings: Endocervical canal appeared normal. The uterus sounded to 8 cm. there was a partial uterine septum descending more than half way down the endometrial cavity (approximately 3 cm wit deep) with a medium-sized base.  Description of procedure: Patient was placed in dorsal supine position. General anesthesia was administered. She was placed in lithotomy position. She was prepped and draped in sterile manner. A vaginal speculum was placed. A dilute vasopressin solution containing 0.33 units per milliliter was injected into the cervical stroma x 5 cc. A Slimline hysteroscope with 12 lens was inserted into the canal and above findings were noted. Distention medium was normal saline. Distention method was gravity.  We first used hysteroscopic scissors to carefully dissected lower and narrow aspect of the septum. Staying at the inter-tubal ostial coronal plane the dissection was carried cephalad. When we reached though wider and more fibrotic base of the septum we switched to a  bipolar knife electrode. Using cutting current the dissection was carried out cephalad and the septum was taken down almost up to the level of the inter-ostial line, giving the uterus a slightly arcuate fundus. Hemostasis was insured. In order to reduce the risk of intrauterine adhesion formation we rolled up a large sheet of Seprafilm around long DeBakey forceps marketed at 8 cm and inserted it into the cavity until the tip touch the fundus. At this point the role was trimmed off at the external os of the cervix and kept in place.  Instrument  count was correct. Estimated blood loss was less than 20 mL. The patient tolerated the procedure well and was transferred to recovery in satisfactory condition. She will take Estrace 3 mg twice daily for 30 days, to the last 5 days of which medroxyprogesterone acetate 10 mg daily will be added. I will do a saline contrast sonohysterogram in my office in 6 weeks.   Fermin SchwabYALCINKAYA,Demetrias Goodbar, MD

## 2016-08-27 NOTE — Anesthesia Postprocedure Evaluation (Signed)
Anesthesia Post Note  Patient: Anna Brewer  Procedure(s) Performed: Procedure(s) (LRB): HYSTEROSCOPY excision of uterine septum (N/A)  Patient location during evaluation: PACU Anesthesia Type: General Level of consciousness: awake and alert Pain management: pain level controlled Vital Signs Assessment: post-procedure vital signs reviewed and stable Respiratory status: spontaneous breathing, nonlabored ventilation, respiratory function stable and patient connected to nasal cannula oxygen Cardiovascular status: blood pressure returned to baseline and stable Postop Assessment: no signs of nausea or vomiting Anesthetic complications: no    Last Vitals:  Vitals:   08/27/16 1445 08/27/16 1500  BP: 120/72 109/80  Pulse: 70 63  Resp: 13 15  Temp:      Last Pain:  Vitals:   08/27/16 1002  TempSrc: Oral                 Kennieth RadFitzgerald, [name]

## 2016-08-27 NOTE — Anesthesia Procedure Notes (Signed)
Procedure Name: LMA Insertion Date/Time: 08/27/2016 1:34 PM Performed by: Tyrone NineSAUVE, Ioanna Colquhoun F Pre-anesthesia Checklist: Patient identified, Timeout performed, Emergency Drugs available, Suction available and Patient being monitored Patient Re-evaluated:Patient Re-evaluated prior to inductionOxygen Delivery Method: Circle system utilized Preoxygenation: Pre-oxygenation with 100% oxygen Intubation Type: IV induction Ventilation: Mask ventilation without difficulty LMA: LMA inserted LMA Size: 4.0 Number of attempts: 1 Placement Confirmation: positive ETCO2 and breath sounds checked- equal and bilateral Tube secured with: Tape Dental Injury: Teeth and Oropharynx as per pre-operative assessment

## 2016-08-28 ENCOUNTER — Encounter (HOSPITAL_BASED_OUTPATIENT_CLINIC_OR_DEPARTMENT_OTHER): Payer: Self-pay | Admitting: Obstetrics and Gynecology

## 2017-11-26 LAB — OB RESULTS CONSOLE RPR: RPR: NONREACTIVE

## 2017-11-26 LAB — OB RESULTS CONSOLE HIV ANTIBODY (ROUTINE TESTING): HIV: NONREACTIVE

## 2017-11-26 LAB — OB RESULTS CONSOLE GC/CHLAMYDIA
CHLAMYDIA, DNA PROBE: NEGATIVE
Gonorrhea: NEGATIVE

## 2017-11-26 LAB — OB RESULTS CONSOLE ABO/RH: RH TYPE: POSITIVE

## 2017-11-26 LAB — OB RESULTS CONSOLE HEPATITIS B SURFACE ANTIGEN: HEP B S AG: NEGATIVE

## 2017-11-26 LAB — OB RESULTS CONSOLE RUBELLA ANTIBODY, IGM: RUBELLA: IMMUNE

## 2017-11-26 LAB — OB RESULTS CONSOLE ANTIBODY SCREEN: Antibody Screen: NEGATIVE

## 2017-12-16 NOTE — L&D Delivery Note (Signed)
Delivery Note At 6:36 PM a viable female was delivered via Vaginal, Spontaneous (Presentation: LOA).  APGAR: 8, 9; weight pending.   Placenta status: S, I. 3V Cord with the following complications: none.  Cord pH: n/a  Anesthesia:  CLEA Episiotomy: None Lacerations: Perineal;3rd degree Suture Repair: 3.0 vicryl rapide Est. Blood Loss (mL):  500  Mom to postpartum.  Baby to Couplet care / Skin to Skin.  Braylen Staller 06/16/2018, 7:07 PM

## 2018-04-26 ENCOUNTER — Encounter (HOSPITAL_COMMUNITY): Payer: Self-pay

## 2018-04-26 ENCOUNTER — Inpatient Hospital Stay (HOSPITAL_COMMUNITY)
Admission: AD | Admit: 2018-04-26 | Discharge: 2018-04-28 | DRG: 833 | Disposition: A | Discharge status: Home / Self Care | Payer: 59 | Source: Ambulatory Visit | Attending: Obstetrics & Gynecology | Admitting: Obstetrics & Gynecology

## 2018-04-26 DIAGNOSIS — D649 Anemia, unspecified: Secondary | ICD-10-CM | POA: Diagnosis present

## 2018-04-26 DIAGNOSIS — O99013 Anemia complicating pregnancy, third trimester: Secondary | ICD-10-CM | POA: Diagnosis present

## 2018-04-26 DIAGNOSIS — Z3A32 32 weeks gestation of pregnancy: Secondary | ICD-10-CM

## 2018-04-26 LAB — URINALYSIS, ROUTINE W REFLEX MICROSCOPIC
Bilirubin Urine: NEGATIVE
GLUCOSE, UA: NEGATIVE mg/dL
HGB URINE DIPSTICK: NEGATIVE
Ketones, ur: NEGATIVE mg/dL
NITRITE: NEGATIVE
PH: 7 (ref 5.0–8.0)
Protein, ur: NEGATIVE mg/dL
Specific Gravity, Urine: 1.006 (ref 1.005–1.030)

## 2018-04-26 LAB — COMPREHENSIVE METABOLIC PANEL
ALK PHOS: 90 U/L (ref 38–126)
ALT: 11 U/L — ABNORMAL LOW (ref 14–54)
AST: 16 U/L (ref 15–41)
Albumin: 3 g/dL — ABNORMAL LOW (ref 3.5–5.0)
Anion gap: 10 (ref 5–15)
BILIRUBIN TOTAL: 0.6 mg/dL (ref 0.3–1.2)
BUN: <5 mg/dL — ABNORMAL LOW (ref 6–20)
CALCIUM: 8.3 mg/dL — AB (ref 8.9–10.3)
CO2: 20 mmol/L — ABNORMAL LOW (ref 22–32)
Chloride: 105 mmol/L (ref 101–111)
Creatinine, Ser: 0.45 mg/dL (ref 0.44–1.00)
Glucose, Bld: 86 mg/dL (ref 65–99)
Potassium: 3.6 mmol/L (ref 3.5–5.1)
Sodium: 135 mmol/L (ref 135–145)
TOTAL PROTEIN: 6.7 g/dL (ref 6.5–8.1)

## 2018-04-26 LAB — CBC
HCT: 33.1 % — ABNORMAL LOW (ref 36.0–46.0)
HEMOGLOBIN: 10.2 g/dL — AB (ref 12.0–15.0)
MCH: 26.5 pg (ref 26.0–34.0)
MCHC: 30.8 g/dL (ref 30.0–36.0)
MCV: 86 fL (ref 78.0–100.0)
Platelets: 203 10*3/uL (ref 150–400)
RBC: 3.85 MIL/uL — AB (ref 3.87–5.11)
RDW: 14.8 % (ref 11.5–15.5)
WBC: 16.4 10*3/uL — ABNORMAL HIGH (ref 4.0–10.5)

## 2018-04-26 LAB — FETAL FIBRONECTIN: Fetal Fibronectin: NEGATIVE

## 2018-04-26 MED ORDER — ACETAMINOPHEN 325 MG PO TABS: 650.0000 mg | ORAL_TABLET | ORAL | Status: DC | PRN

## 2018-04-26 MED ORDER — POLYETHYLENE GLYCOL 3350 17 G PO PACK
17.0000 g | PACK | Freq: Every day | ORAL | Status: DC
  Administered 2018-04-27 – 2018-04-28 (×2): 17 g via ORAL
  Filled 2018-04-26 (×3): qty 1

## 2018-04-26 MED ORDER — ZOLPIDEM TARTRATE 5 MG PO TABS
5.0000 mg | ORAL_TABLET | Freq: Every evening | ORAL | Status: DC | PRN
  Administered 2018-04-28: 5 mg via ORAL
  Filled 2018-04-26: qty 1

## 2018-04-26 MED ORDER — PENICILLIN G POT IN DEXTROSE 60000 UNIT/ML IV SOLN
3.0000 10*6.[IU] | INTRAVENOUS | Status: DC
  Administered 2018-04-27 (×3): 3 10*6.[IU] via INTRAVENOUS
  Filled 2018-04-26 (×3): qty 50

## 2018-04-26 MED ORDER — SODIUM CHLORIDE 0.9 % IV SOLN
5.0000 10*6.[IU] | Freq: Once | INTRAVENOUS | Status: AC
  Administered 2018-04-27: 5 10*6.[IU] via INTRAVENOUS
  Filled 2018-04-26: qty 5

## 2018-04-26 MED ORDER — MAGNESIUM SULFATE BOLUS VIA INFUSION
6.0000 g | Freq: Once | INTRAVENOUS | Status: AC
  Administered 2018-04-27: 6 g via INTRAVENOUS
  Filled 2018-04-26: qty 500

## 2018-04-26 MED ORDER — MAGNESIUM SULFATE 40 G IN LACTATED RINGERS - SIMPLE
2.0000 g/h | INTRAVENOUS | Status: DC
  Administered 2018-04-27 (×2): 2 g/h via INTRAVENOUS
  Filled 2018-04-26 (×2): qty 40

## 2018-04-26 MED ORDER — FAMOTIDINE 20 MG PO TABS
20.0000 mg | ORAL_TABLET | Freq: Every day | ORAL | Status: DC
  Administered 2018-04-27 (×2): 20 mg via ORAL
  Filled 2018-04-26 (×2): qty 1

## 2018-04-26 MED ORDER — PRENATAL MULTIVITAMIN CH
1.0000 | ORAL_TABLET | Freq: Every day | ORAL | Status: DC
  Administered 2018-04-27 – 2018-04-28 (×2): 1 via ORAL
  Filled 2018-04-26 (×3): qty 1

## 2018-04-26 MED ORDER — NIFEDIPINE 10 MG PO CAPS
10.0000 mg | ORAL_CAPSULE | ORAL | Status: DC | PRN
  Administered 2018-04-26 – 2018-04-28 (×2): 10 mg via ORAL
  Filled 2018-04-26 (×2): qty 1

## 2018-04-26 MED ORDER — DOCUSATE SODIUM 100 MG PO CAPS
100.0000 mg | ORAL_CAPSULE | Freq: Every day | ORAL | Status: DC
  Administered 2018-04-27 – 2018-04-28 (×2): 100 mg via ORAL
  Filled 2018-04-26 (×3): qty 1

## 2018-04-26 MED ORDER — CALCIUM CARBONATE ANTACID 500 MG PO CHEW: 2.0000 | CHEWABLE_TABLET | ORAL | Status: DC | PRN

## 2018-04-26 MED ORDER — LACTATED RINGERS IV SOLN
INTRAVENOUS | Status: DC
  Administered 2018-04-27 – 2018-04-28 (×4): via INTRAVENOUS

## 2018-04-26 MED ORDER — BETAMETHASONE SOD PHOS & ACET 6 (3-3) MG/ML IJ SUSP
12.0000 mg | INTRAMUSCULAR | Status: AC
  Administered 2018-04-26 – 2018-04-27 (×2): 12 mg via INTRAMUSCULAR
  Filled 2018-04-26 (×2): qty 2

## 2018-04-26 NOTE — MAU Note (Signed)
Pt here with abdominal pains for the last hour, mostly on the right side. Reports good fetal movement. Denies any leaking of fluid or bleeding.

## 2018-04-26 NOTE — Progress Notes (Signed)
BP= 86/54  PR=81  , Anna Brewer CNM informed on BP reading before giving patient Procardia cap.  Provider at bedside and informed patient on effect of procardia on BP.  Administered med and encouraged pt to call for assist if OOB.

## 2018-04-26 NOTE — H&P (Addendum)
Anna Brewer is a 32 y.o. female presenting for preterm labor eval at [redacted]w[redacted]d.  She reports being at dinner tonight when she had sudden onset RUQ pain.  She presented immediately to the MAU but the pain resolved completely in 1 hour.  While in the MAU, CTX were noted on toco and have gradually intensified in intensity; q 2-5 minutes.  Procardia did not improve her symptoms nor did aggressive po hydration.  She changed her cervix from closed and thick to 1/60/ballotable.  FFN was negative. She denies LOF or VB.  Active FM. Antepartum course complicated by h/o PPROM at 28 weeks with PTD at 32 weeks.  She has received weekly 17-OHP this pregnancy.  Following her first pregnancy, the patient had excision of uterine septum with Dr. April Manson.  She has h/o asthma and migraines.  OB History    Gravida  2   Para  1   Term      Preterm  1   AB      Living  1     SAB      TAB      Ectopic      Multiple  0   Live Births  1          Past Medical History:  Diagnosis Date  . GERD (gastroesophageal reflux disease)   . Migraine   . Mild intermittent asthma   . Wears glasses    Past Surgical History:  Procedure Laterality Date  . HYSTEROSCOPY N/A 08/27/2016   Procedure: HYSTEROSCOPY excision of uterine septum;  Surgeon: Fermin Schwab, MD;  Location: [facility];  Service: Gynecology;  Laterality: N/A;  . TONSILLECTOMY  2007  . WISDOM TOOTH EXTRACTION     Family History: family history includes Cancer in her maternal grandfather and paternal grandfather. Social History:  reports that she has never smoked. She has never used smokeless tobacco. She reports that she does not drink alcohol or use drugs.     Maternal Diabetes: No Genetic Screening: Normal Maternal Ultrasounds/Referrals: Normal Fetal Ultrasounds or other Referrals:  None Maternal Substance Abuse:  No Significant Maternal Medications:  None Significant Maternal Lab Results:  None Other Comments:   None  Review of Systems  Constitutional: Negative for chills and fever.  Cardiovascular: Negative for chest pain.  Gastrointestinal: Positive for abdominal pain, constipation and heartburn. Negative for nausea and vomiting.  Genitourinary: Negative for dysuria and frequency.   Maternal Medical History:  Reason for admission: Contractions.  Nausea.  Contractions: Onset was 1-2 hours ago.   Frequency: regular.   Perceived severity is moderate.    Fetal activity: Perceived fetal activity is normal.   Last perceived fetal movement was within the past hour.    Prenatal complications: no prenatal complications Prenatal Complications - Diabetes: none.   Cat I fetal monitoring Toco: q 10 min   Dilation: 1 Effacement (%): 60 Station: Ballotable Exam by:: C. Neill CNM Blood pressure (!) 86/53, pulse 81, temperature 98.3 F (36.8 C), temperature source Oral, resp. rate 18, height  (1.702 m), weight 184 lb (83.5 kg), SpO2 100 %, currently breastfeeding. Maternal Exam:  Uterine Assessment: Contraction strength is moderate.  Contraction frequency is regular.   Abdomen: Patient reports no abdominal tenderness. Fundal height is c/w dates.   Estimated fetal weight is 4#.       Physical Exam  Constitutional: She is oriented to person, place, and time. She appears well-developed and well-nourished.  GI: Soft. There is no rebound  and no guarding.  Neurological: She is alert and oriented to person, place, and time.  Skin: Skin is warm and dry.  Psychiatric: She has a normal mood and affect. Her behavior is normal.    Prenatal labs: ABO, Rh:   Antibody:   Rubella:   RPR:    HBsAg:    HIV:    GBS:     Assessment/Plan: 31yo G2P0101 at [redacted]w[redacted]d with PTL -Magnesium sulfate -BMZ -PCN for GBS ppx; collect rapid GBS probe -CEFM -U/S for CL and presentation -SCDs   [name] 04/26/2018, 10:58 PM

## 2018-04-26 NOTE — MAU Provider Note (Signed)
History     CSN: [id number]  Arrival date and time: 04/26/18 1950   First Provider Initiated Contact with Patient 04/26/18 2043      Chief Complaint  Patient presents with  . Abdominal Pain   HPI Anna Brewer is a 32 y.o. G2P0101 at [redacted]w[redacted]d who presents with abdominal pain. She states at 1800 she started feeling pain in her upper right quadrant. She states the pain went on for about an hour and has since stopped. She reports feeling more frequent braxton hicks contractions today as well. She denies any leaking or bleeding. Reports good fetal movement. She has a history of preterm delivery at 32 weeks with her first pregnancy and started feeling very nervous about this last night.   OB History    Gravida  2   Para  1   Term      Preterm  1   AB      Living  1     SAB      TAB      Ectopic      Multiple  0   Live Births  1           Past Medical History:  Diagnosis Date  . GERD (gastroesophageal reflux disease)   . Migraine   . Mild intermittent asthma   . Wears glasses     Past Surgical History:  Procedure Laterality Date  . HYSTEROSCOPY N/A 08/27/2016   Procedure: HYSTEROSCOPY excision of uterine septum;  Surgeon: Fermin Schwab, MD;  Location: [facility];  Service: Gynecology;  Laterality: N/A;  . TONSILLECTOMY  2007  . WISDOM TOOTH EXTRACTION      Family History  Problem Relation Age of Onset  . Cancer Maternal Grandfather   . Cancer Paternal Grandfather     Social History   Tobacco Use  . Smoking status: Never Smoker  . Smokeless tobacco: Never Used  Substance Use Topics  . Alcohol use: No  . Drug use: No    Allergies: No Known Allergies  Medications Prior to Admission  Medication Sig Dispense Refill Last Dose  . acetaminophen (TYLENOL) 500 MG tablet Take 1,000 mg by mouth every 6 (six) hours as needed.   Past Week at Unknown time  . albuterol (PROVENTIL HFA;VENTOLIN HFA) 108 (90 BASE) MCG/ACT inhaler Inhale 1-2  puffs into the lungs every 6 (six) hours as needed for wheezing or shortness of breath.   Unknown at Unknown time  . estradiol (ESTRACE) 2 MG tablet Take 1.5 tablets (3 mg total) by mouth 2 (two) times daily after a meal. 90 tablet 0 Unknown at Unknown time  . medroxyPROGESTERone (PROVERA) 10 MG tablet Take 1 tablet (10 mg total) by mouth daily. START THESE PILLS IN 25 DAYS! 5 tablet 0 Unknown at Unknown time  . oxyCODONE-acetaminophen (ROXICET) 5-325 MG tablet Take 1 tablet by mouth every 4 (four) hours as needed for severe pain. 20 tablet 0 More than a month at Unknown time    Review of Systems  Constitutional: Negative.  Negative for fatigue and fever.  HENT: Negative.   Respiratory: Negative.  Negative for shortness of breath.   Cardiovascular: Negative.  Negative for chest pain.  Gastrointestinal: Positive for abdominal pain. Negative for constipation, diarrhea, nausea and vomiting.  Genitourinary: Negative.  Negative for dysuria, vaginal bleeding and vaginal discharge.  Neurological: Negative.  Negative for dizziness and headaches.   Physical Exam   Blood pressure 111/66, pulse (!) 106, temperature 98.3 F (36.8  C), temperature source Oral, resp. rate 18, height  (1.702 m), weight 184 lb (83.5 kg), SpO2 100 %, currently breastfeeding.  Physical Exam  Nursing note and vitals reviewed. Constitutional: She is oriented to person, place, and time. She appears well-developed and well-nourished. No distress.  HENT:  Head: Normocephalic.  Eyes: Pupils are equal, round, and reactive to light.  Cardiovascular: Normal rate, regular rhythm and normal heart sounds.  Respiratory: Effort normal and breath sounds normal. No respiratory distress.  GI: Soft. Bowel sounds are normal. She exhibits no distension. There is no tenderness.  Neurological: She is alert and oriented to person, place, and time.  Skin: Skin is warm and dry.  Psychiatric: She has a normal mood and affect. Her behavior is  normal. Judgment and thought content normal.   Fetal Tracing:  Baseline: 130 Variability: moderate Accels: 15x15 Decels: none  Toco: 2-5  Dilation: Closed Effacement (%): Thick Cervical Position: Posterior Exam by:: Cleone Slim CNM   MAU Course  Procedures Results for orders placed or performed during the hospital encounter of 04/26/18 (from the past 24 hour(s))  Urinalysis, Routine w reflex microscopic     Status: Abnormal   Collection Time: 04/26/18  8:20 PM  Result Value Ref Range   Color, Urine YELLOW YELLOW   APPearance HAZY (A) CLEAR   Specific Gravity, Urine 1.006 1.005 - 1.030   pH 7.0 5.0 - 8.0   Glucose, UA NEGATIVE NEGATIVE mg/dL   Hgb urine dipstick NEGATIVE NEGATIVE   Bilirubin Urine NEGATIVE NEGATIVE   Ketones, ur NEGATIVE NEGATIVE mg/dL   Protein, ur NEGATIVE NEGATIVE mg/dL   Nitrite NEGATIVE NEGATIVE   Leukocytes, UA LARGE (A) NEGATIVE   RBC / HPF 0-5 0 - 5 RBC/hpf   WBC, UA 11-20 0 - 5 WBC/hpf   Bacteria, UA MANY (A) NONE SEEN   Squamous Epithelial / LPF 0-5 0 - 5   Mucus PRESENT   Fetal fibronectin     Status: None   Collection Time: 04/26/18  8:50 PM  Result Value Ref Range   Fetal Fibronectin NEGATIVE NEGATIVE  CBC     Status: Abnormal   Collection Time: 04/26/18  9:23 PM  Result Value Ref Range   WBC 16.4 (H) 4.0 - 10.5 K/uL   RBC 3.85 (L) 3.87 - 5.11 MIL/uL   Hemoglobin 10.2 (L) 12.0 - 15.0 g/dL   HCT 16.1 (L) 09.6 - 04.5 %   MCV 86.0 78.0 - 100.0 fL   MCH 26.5 26.0 - 34.0 pg   MCHC 30.8 30.0 - 36.0 g/dL   RDW 40.9 81.1 - 91.4 %   Platelets 203 150 - 400 K/uL   MDM UA FFN- Negative CBC, CMP  CNM recommended IV fluids for tocolysis. Patient with severe phobia of IVs and requested to PO hydrate.  Contractions spaced out after PO fluids but patient still reporting painful contractions Procardia  PO q77min PRN Patient complaining of increased pain after 1 dose of procardia. Cervix reexamined.  Dilation: 1 Effacement (%):  60 Cervical Position: [place]: Ballotable Exam by:: Ma Hillock CNM  Dr. Langston Masker consulted regarding results and cervical change. Will admit patient.   Assessment and Plan   1. Preterm labor in third trimester without delivery   2. [redacted] weeks gestation of pregnancy    -Admit to [facility] -Dr. Langston Masker to put in admission orders  Rolm Bookbinder CNM 04/26/2018, 8:43 PM

## 2018-04-27 ENCOUNTER — Inpatient Hospital Stay (HOSPITAL_COMMUNITY): Payer: 59

## 2018-04-27 ENCOUNTER — Other Ambulatory Visit: Payer: Self-pay

## 2018-04-27 ENCOUNTER — Encounter (HOSPITAL_COMMUNITY): Payer: Self-pay | Admitting: *Deleted

## 2018-04-27 LAB — TYPE AND SCREEN
ABO/RH(D): O POS
Antibody Screen: NEGATIVE

## 2018-04-27 IMAGING — US US MFM OB TRANSVAGINAL
1 series · 11 of 11 positions shown · non-contrast
Comparison: none

[Series 1: us mfm ob transvaginal · 11 of 11 slices shown]
[im 1/11]
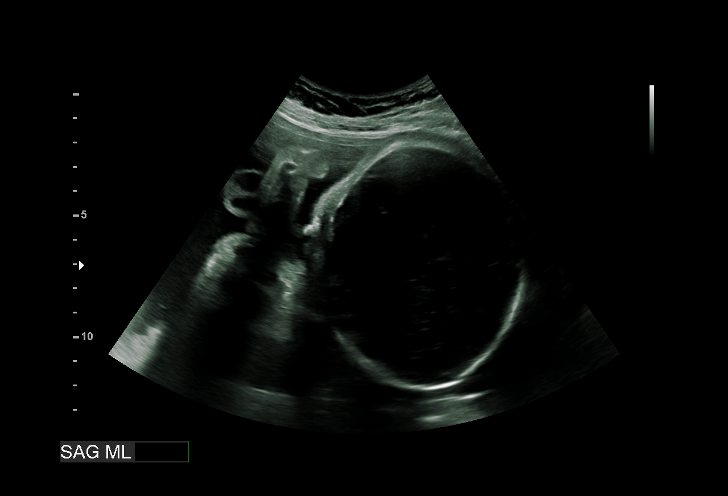
[im 2/11]
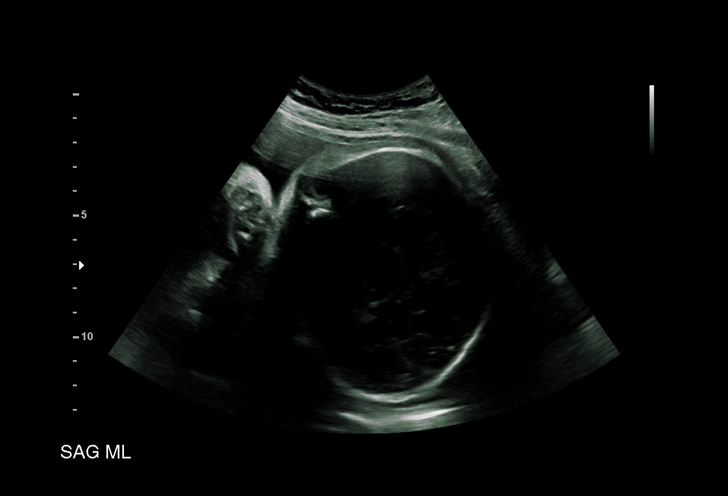
[im 3/11]
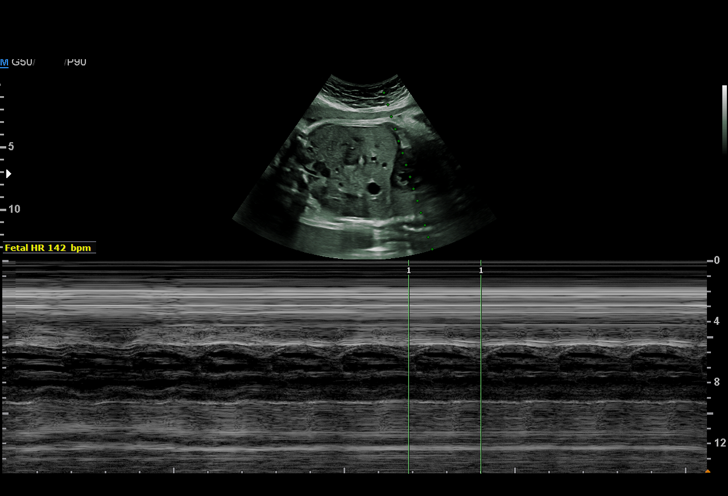
[im 4/11]
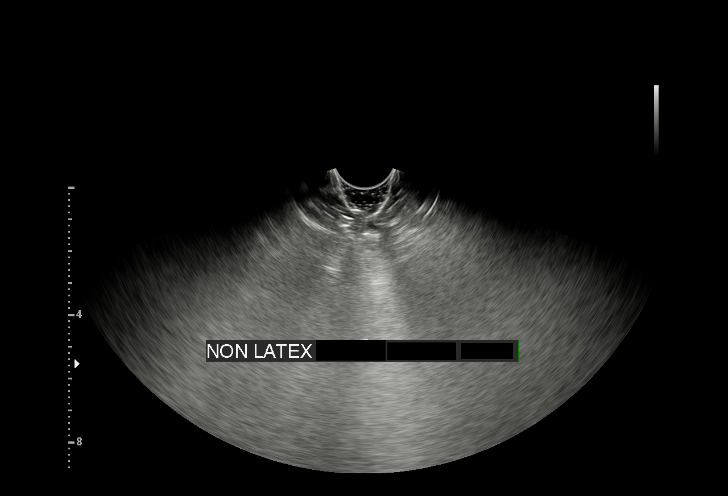
[im 5/11]
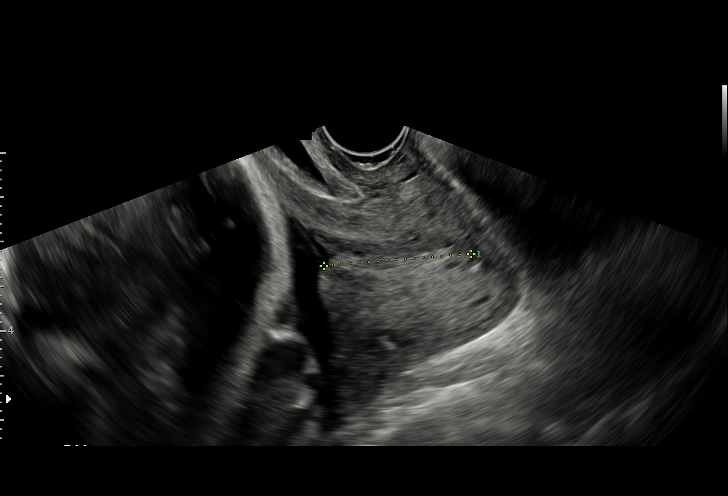
[im 6/11]
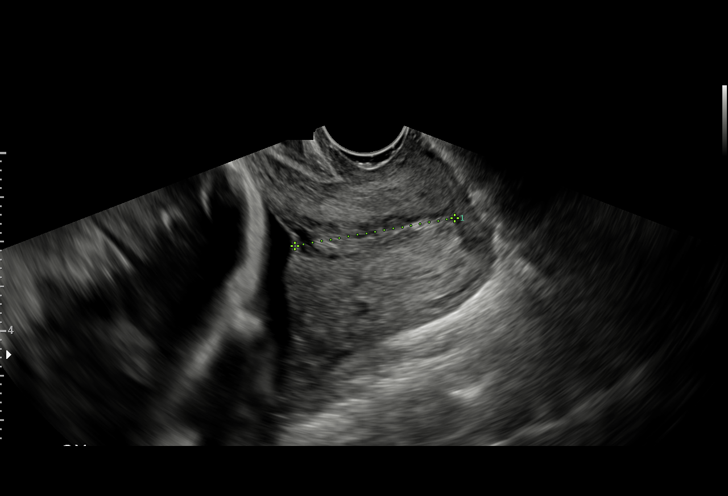
[im 7/11]
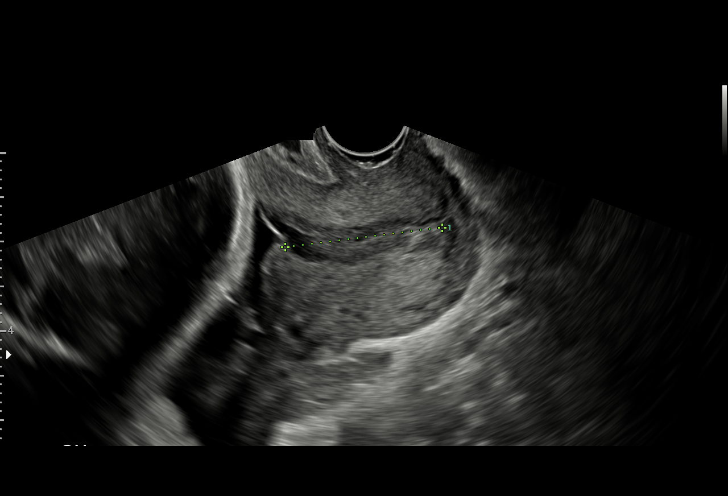
[im 8/11]
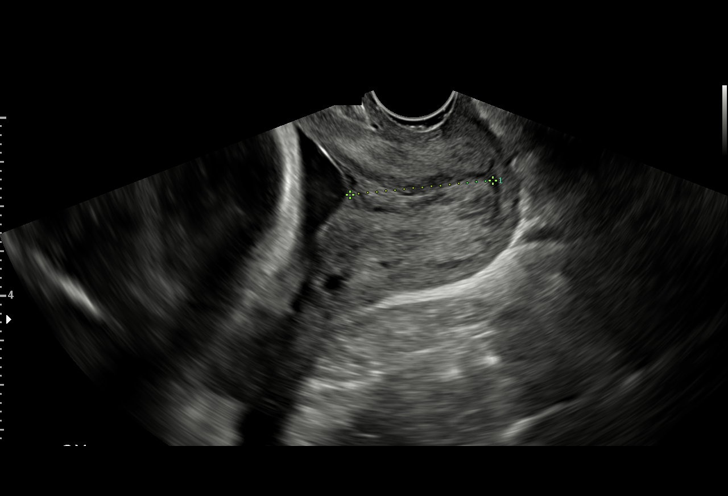
[im 9/11]
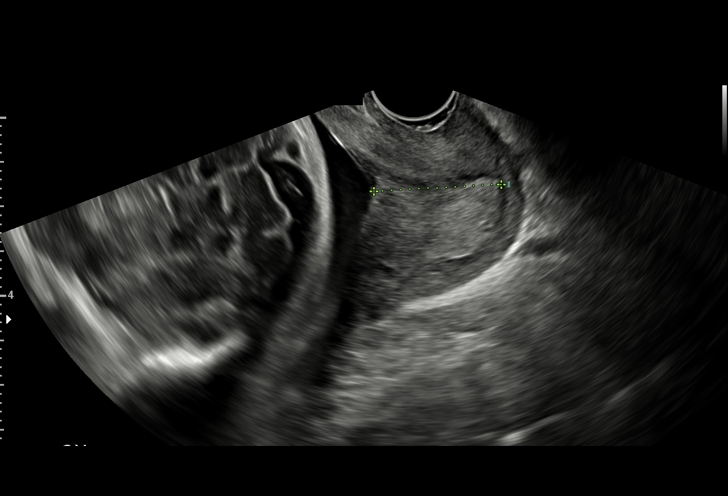
[im 10/11]
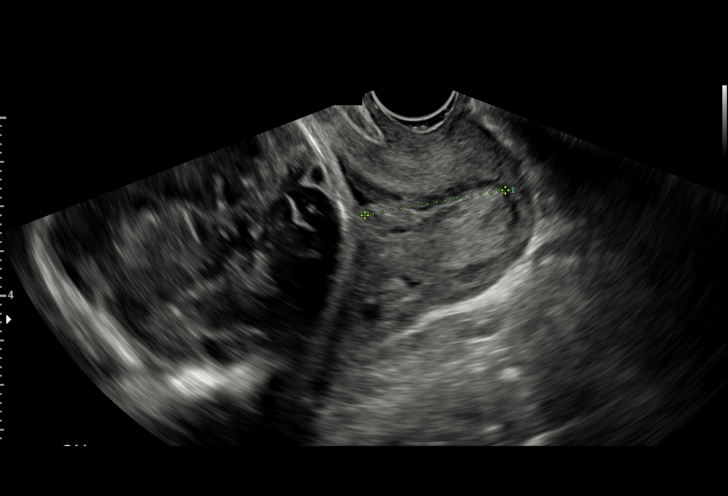
[im 11/11]
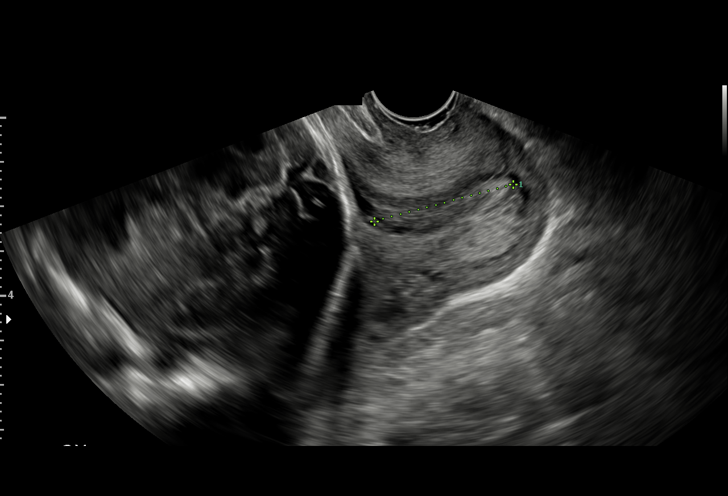

[11 of 11 positions shown; findings below may reference images not displayed]

[REDACTED]. [HOSPITAL]
Attending:        Jraidi Maya       Secondary Phy.:    3rd Nursing- [employer]
OB
DO

1  MACARENA JOSHUA             [id number]      [id number]     [id number]
Indications

32 weeks gestation of pregnancy
Preterm contractions
Poor obstetric history: Previous preterm
delivery, antepartum 32 weeks PPROM
History of uternine septal resection
Encounter for cervical length
Determine Fetal presentation by ultrasound
OB History

Gravidity:    2         Prem:   1
Living:       1
Fetal Evaluation

Num Of Fetuses:     1
Fetal Heart         142
Rate(bpm):
Cardiac Activity:   Observed
Fetal Lie:          14
Presentation:       Cephalic

Amniotic Fluid
AFI FV:      Subjectively within normal limits
Gestational Age

Clinical EDD:  32w 4d                                        EDD:   06/18/18
Best:          32w 4d     Det. By:  Clinical EDD             EDD:   06/18/18
Cervix Uterus Adnexa
Cervix
Length:            3.2  cm.
Measured transvaginally.
Impression

Single living intrauterine pregnancy at 32w 4d.
Cephalic presentation.
no previa demonstrated.
Normal amniotic fluid volume.
The cervix measures 3.2 cm transvaginally without funneling
or debris.
Recommendations

Follow-up ultrasounds as clinically indicated (remote read of
images only).

## 2018-04-27 MED ORDER — ONDANSETRON HCL 4 MG/2ML IJ SOLN
4.0000 mg | Freq: Once | INTRAMUSCULAR | Status: AC
  Administered 2018-04-27: 4 mg via INTRAVENOUS
  Filled 2018-04-27: qty 2

## 2018-04-27 MED ORDER — SODIUM CHLORIDE 0.9 % IV SOLN
510.0000 mg | Freq: Once | INTRAVENOUS | Status: AC
  Administered 2018-04-27: 510 mg via INTRAVENOUS
  Filled 2018-04-27: qty 17

## 2018-04-27 NOTE — Progress Notes (Signed)
   04/27/18 0056  Vital Signs  BP 110/76  BP Location Left Arm  Patient Position (if appropriate) Semi-fowlers  BP Method Automatic  Pulse Rate Source Monitor  Resp 16  Oxygen Therapy  SpO2 98 %  Magnesium 2 G started , pt remain alert and oriented, VSS, reflex 1+, no clonus, lungs CTA. We will continue to monitor.

## 2018-04-27 NOTE — Progress Notes (Signed)
   04/27/18 0028  Vital Signs  BP 107/73  BP Location Left Arm  Patient Position (if appropriate) Lying  BP Method Automatic  Pulse Rate Source Dinamap  Resp 18  Temp 98.7 F (37.1 C)  Temp Source Oral  Oxygen Therapy  SpO2 99 %  O2 Device Room Air  Magnesium  6 G bolus initiated as ordered. VSS, pt education completed and pt verbalized understanding. Lunch CTA, reflex 1+

## 2018-04-27 NOTE — Progress Notes (Signed)
S:   Patient is reporting normal fetal movement. She denies any vaginal bleeding or any discharge. However, she still feels contractions and they are in spurts. Not any worse since admission.  O:  BP 95/63 (BP Location: Right Arm)   Pulse 100   Temp 99.5 F (37.5 C) (Oral)   Resp 20   Ht  (1.702 m)   Wt 83.5 kg (184 lb)   SpO2 97%   BMI 28.82 kg/m   Results for orders placed or performed during the hospital encounter of 04/26/18 (from the past 24 hour(s))  Urinalysis, Routine w reflex microscopic     Status: Abnormal   Collection Time: 04/26/18  8:20 PM  Result Value Ref Range   Color, Urine YELLOW YELLOW   APPearance HAZY (A) CLEAR   Specific Gravity, Urine 1.006 1.005 - 1.030   pH 7.0 5.0 - 8.0   Glucose, UA NEGATIVE NEGATIVE mg/dL   Hgb urine dipstick NEGATIVE NEGATIVE   Bilirubin Urine NEGATIVE NEGATIVE   Ketones, ur NEGATIVE NEGATIVE mg/dL   Protein, ur NEGATIVE NEGATIVE mg/dL   Nitrite NEGATIVE NEGATIVE   Leukocytes, UA LARGE (A) NEGATIVE   RBC / HPF 0-5 0 - 5 RBC/hpf   WBC, UA 11-20 0 - 5 WBC/hpf   Bacteria, UA MANY (A) NONE SEEN   Squamous Epithelial / LPF 0-5 0 - 5   Mucus PRESENT   Fetal fibronectin     Status: None   Collection Time: 04/26/18  8:50 PM  Result Value Ref Range   Fetal Fibronectin NEGATIVE NEGATIVE  CBC     Status: Abnormal   Collection Time: 04/26/18  9:23 PM  Result Value Ref Range   WBC 16.4 (H) 4.0 - 10.5 K/uL   RBC 3.85 (L) 3.87 - 5.11 MIL/uL   Hemoglobin 10.2 (L) 12.0 - 15.0 g/dL   HCT 16.1 (L) 09.6 - 04.5 %   MCV 86.0 78.0 - 100.0 fL   MCH 26.5 26.0 - 34.0 pg   MCHC 30.8 30.0 - 36.0 g/dL   RDW 40.9 81.1 - 91.4 %   Platelets 203 150 - 400 K/uL  Comprehensive metabolic panel     Status: Abnormal   Collection Time: 04/26/18  9:23 PM  Result Value Ref Range   Sodium 135 135 - 145 mmol/L   Potassium 3.6 3.5 - 5.1 mmol/L   Chloride 105 101 - 111 mmol/L   CO2 20 (L) 22 - 32 mmol/L   Glucose, Bld 86 65 - 99 mg/dL   BUN <5 (L) 6 -  20 mg/dL   Creatinine, Ser 7.82 0.44 - 1.00 mg/dL   Calcium 8.3 (L) 8.9 - 10.3 mg/dL   Total Protein 6.7 6.5 - 8.1 g/dL   Albumin 3.0 (L) 3.5 - 5.0 g/dL   AST 16 15 - 41 U/L   ALT 11 (L) 14 - 54 U/L   Alkaline Phosphatase 90 38 - 126 U/L   Total Bilirubin 0.6 0.3 - 1.2 mg/dL   GFR calc non Af Amer >60 >60 mL/min   GFR calc Af Amer >60 >60 mL/min   Anion gap 10 5 - 15  Type and screen [facility]     Status: None   Collection Time: 04/26/18 11:35 PM  Result Value Ref Range   ABO/RH(D) O POS    Antibody Screen NEG    Sample Expiration      04/29/2018 Performed at [facility], [address], [place], [place] [address]  Ultrasound cervical length 3.2 cm  NO FUNNELING NOTED   Toco irregular contractions - hard to trace at the moment because she is sitting up and eating breakfast.  Scheduled Meds: . betamethasone acetate-betamethasone sodium phosphate  12 mg Intramuscular Q24H  . docusate sodium  100 mg Oral Daily  . famotidine  20 mg Oral QHS  . polyethylene glycol  17 g Oral Daily  . prenatal multivitamin  1 tablet Oral Q1200   Continuous Infusions: . lactated ringers 100 mL/hr at 04/27/18 0056  . magnesium sulfate 2 g/hr (04/27/18 0056)  . pencillin G potassium IV Stopped (04/27/18 0334)   PRN Meds:.acetaminophen, calcium carbonate, NIFEdipine, zolpidem  Impression: IUP at 32 w 4 days PTL  PLAN: Of note FFN on admission was Negative However, given she is still feeling contractions I am going to increase her Magnesium to 2.5 gram / hour Continue other medications Complete her steroid series Plan of care reviewed with patient and her husband

## 2018-04-27 NOTE — Progress Notes (Signed)
   04/27/18 0049  Vital Signs  BP 109/61  BP Location Left Arm  Patient Position (if appropriate) Lying  BP Method Automatic  Pulse Rate Source Dinamap  Resp 19  Oxygen Therapy  SpO2 97 %  O2 Device Room Air  Pt Reassess: Pt remain alert and oriented x 4, VSS, lungs CTA, reflex 1+ , no clonus. We will continue to monitor.

## 2018-04-28 DIAGNOSIS — D649 Anemia, unspecified: Secondary | ICD-10-CM | POA: Diagnosis present

## 2018-04-28 DIAGNOSIS — O99013 Anemia complicating pregnancy, third trimester: Secondary | ICD-10-CM | POA: Diagnosis present

## 2018-04-28 DIAGNOSIS — Z3A32 32 weeks gestation of pregnancy: Secondary | ICD-10-CM | POA: Diagnosis not present

## 2018-04-28 MED ORDER — CYCLOBENZAPRINE HCL 10 MG PO TABS: 10.0000 mg | ORAL_TABLET | Freq: Three times a day (TID) | ORAL | 0 refills | Status: DC | PRN

## 2018-04-28 MED ORDER — CYCLOBENZAPRINE HCL 10 MG PO TABS
10.0000 mg | ORAL_TABLET | Freq: Three times a day (TID) | ORAL | Status: DC | PRN
  Administered 2018-04-28 (×2): 10 mg via ORAL
  Filled 2018-04-28 (×4): qty 1

## 2018-04-28 NOTE — Discharge Summary (Signed)
Physician Discharge Summary  Patient ID: Anna Brewer MRN: [id number] DOB/AGE: May 23, 1986 31 y.o.  Admit date: 04/26/2018 Discharge date: 04/28/2018  Admission Diagnoses:[redacted]w[redacted]d  PTL//ANEMIA   Discharge Diagnoses: same Active Problems:   Preterm labor   Discharged Condition: good  Hospital Course: adm for MgSO4 + BMZ for preterm ctx, Her FFN was NEG with NORMAL [place] and CX length, OBSV after BMZ series and after D/C Mag, w/ only muld irreg ctx  Consults: None  Significant Diagnostic Studies: labs: FFN NEG  Treatments: Ferraheme, BMZ, Mag tocolysis  Discharge Exam: Blood pressure 105/64, pulse (!) 103, temperature 98.8 F (37.1 C), temperature source Axillary, resp. rate 16, height  (1.702 m), weight 184 lb (83.5 kg), SpO2 98 %, currently breastfeeding. fundus soft, nontender  Disposition: Discharge disposition: 01-Home or Self Care         Follow-up Information    [place] Follow up.   Why:  has appt tomorrow Contact information: [address] [address] [place] [address] [phone]           Signed: Meriel Pica 04/28/2018, 4:46 PM

## 2018-04-28 NOTE — Progress Notes (Signed)
Pt having irregular ctx's. Pt had been sleeping through them without complaint. Pt medicated with Procardia. Will continue to monitor. Carmelina Dane, RN

## 2018-04-28 NOTE — Progress Notes (Signed)
[redacted]w[redacted]d  S/// c/o L upper back pain, no signif ctx  O// BP 100/66 (BP Location: Left Arm)   Pulse 90   Temp 98 F (36.7 C) (Oral)   Resp 16   Ht  (1.702 m)   Wt 184 lb (83.5 kg)   SpO2 98%   BMI 28.82 kg/m   Fundus is soft, pain more muscular, L mid back FHR stable A+P// [redacted]w[redacted]d, PTL w/ NEG FFN, NORMAL cx length  Will D/C Mag Heat + flexeril for back pain obsv OFF Mg

## 2018-04-30 ENCOUNTER — Other Ambulatory Visit (HOSPITAL_COMMUNITY): Payer: Self-pay

## 2018-05-01 ENCOUNTER — Ambulatory Visit (HOSPITAL_COMMUNITY)
Admission: RE | Admit: 2018-05-01 | Discharge: 2018-05-01 | Disposition: A | Discharge status: Home / Self Care | Payer: 59 | Source: Ambulatory Visit | Attending: Obstetrics and Gynecology | Admitting: Obstetrics and Gynecology

## 2018-05-01 DIAGNOSIS — D509 Iron deficiency anemia, unspecified: Secondary | ICD-10-CM | POA: Insufficient documentation

## 2018-05-01 DIAGNOSIS — Z3A00 Weeks of gestation of pregnancy not specified: Secondary | ICD-10-CM | POA: Insufficient documentation

## 2018-05-01 DIAGNOSIS — O99019 Anemia complicating pregnancy, unspecified trimester: Secondary | ICD-10-CM | POA: Diagnosis not present

## 2018-05-01 MED ORDER — FERUMOXYTOL INJECTION 510 MG/17 ML
510.0000 mg | Freq: Once | INTRAVENOUS | Status: AC
  Administered 2018-05-01: 510 mg via INTRAVENOUS
  Filled 2018-05-01: qty 17

## 2018-05-25 ENCOUNTER — Inpatient Hospital Stay (HOSPITAL_COMMUNITY)
Admission: AD | Admit: 2018-05-25 | Discharge: 2018-05-26 | Disposition: A | Discharge status: Home / Self Care | Payer: 59 | Source: Ambulatory Visit | Attending: Obstetrics & Gynecology | Admitting: Obstetrics & Gynecology

## 2018-05-25 ENCOUNTER — Encounter (HOSPITAL_COMMUNITY): Payer: Self-pay | Admitting: *Deleted

## 2018-05-25 DIAGNOSIS — Z3A39 39 weeks gestation of pregnancy: Secondary | ICD-10-CM | POA: Insufficient documentation

## 2018-05-25 DIAGNOSIS — O26893 Other specified pregnancy related conditions, third trimester: Secondary | ICD-10-CM | POA: Insufficient documentation

## 2018-05-25 DIAGNOSIS — O479 False labor, unspecified: Secondary | ICD-10-CM

## 2018-05-25 NOTE — MAU Note (Signed)
PT SAYS FEELS STRONG UC  AT 8PM.   VE IN OFFICE 2 WEEKS AGO - CLOSED.    DENIES HSV AND MRSA. GBS- UNSURE

## 2018-05-26 DIAGNOSIS — O26893 Other specified pregnancy related conditions, third trimester: Secondary | ICD-10-CM | POA: Diagnosis present

## 2018-05-26 DIAGNOSIS — Z3A39 39 weeks gestation of pregnancy: Secondary | ICD-10-CM | POA: Diagnosis not present

## 2018-05-26 NOTE — Discharge Instructions (Signed)
Braxton Hicks Contractions °Contractions of the uterus can occur throughout pregnancy, but they are not always a sign that you are in labor. You may have practice contractions called Braxton Hicks contractions. These false labor contractions are sometimes confused with true labor. °What are Braxton Hicks contractions? °Braxton Hicks contractions are tightening movements that occur in the muscles of the uterus before labor. Unlike true labor contractions, these contractions do not result in opening (dilation) and thinning of the cervix. Toward the end of pregnancy (32-34 weeks), Braxton Hicks contractions can happen more often and may become stronger. These contractions are sometimes difficult to tell apart from true labor because they can be very uncomfortable. You should not feel embarrassed if you go to the hospital with false labor. °Sometimes, the only way to tell if you are in true labor is for your health care provider to look for changes in the cervix. The health care provider will do a physical exam and may monitor your contractions. If you are not in true labor, the exam should show that your cervix is not dilating and your water has not broken. °If there are other health problems associated with your pregnancy, it is completely safe for you to be sent home with false labor. You may continue to have Braxton Hicks contractions until you go into true labor. °How to tell the difference between true labor and false labor °True labor °· Contractions last 30-70 seconds. °· Contractions become very regular. °· Discomfort is usually felt in the top of the uterus, and it spreads to the lower abdomen and low back. °· Contractions do not go away with walking. °· Contractions usually become more intense and increase in frequency. °· The cervix dilates and gets thinner. °False labor °· Contractions are usually shorter and not as strong as true labor contractions. °· Contractions are usually irregular. °· Contractions  are often felt in the front of the lower abdomen and in the groin. °· Contractions may go away when you walk around or change positions while lying down. °· Contractions get weaker and are shorter-lasting as time goes on. °· The cervix usually does not dilate or become thin. °Follow these instructions at home: °· Take over-the-counter and prescription medicines only as told by your health care provider. °· Keep up with your usual exercises and follow other instructions from your health care provider. °· Eat and drink lightly if you think you are going into labor. °· If Braxton Hicks contractions are making you uncomfortable: °? Change your position from lying down or resting to walking, or change from walking to resting. °? Sit and rest in a tub of warm water. °? Drink enough fluid to keep your urine pale yellow. Dehydration may cause these contractions. °? Do slow and deep breathing several times an hour. °· Keep all follow-up prenatal visits as told by your health care provider. This is important. °Contact a health care provider if: °· You have a fever. °· You have continuous pain in your abdomen. °Get help right away if: °· Your contractions become stronger, more regular, and closer together. °· You have fluid leaking or gushing from your vagina. °· You pass blood-tinged mucus (bloody show). °· You have bleeding from your vagina. °· You have low back pain that you never had before. °· You feel your baby’s head pushing down and causing pelvic pressure. °· Your baby is not moving inside you as much as it used to. °Summary °· Contractions that occur before labor are called Braxton   Hicks contractions, false labor, or practice contractions. °· Braxton Hicks contractions are usually shorter, weaker, farther apart, and less regular than true labor contractions. True labor contractions usually become progressively stronger and regular and they become more frequent. °· Manage discomfort from Braxton Hicks contractions by  changing position, resting in a warm bath, drinking plenty of water, or practicing deep breathing. °This information is not intended to replace advice given to you by your health care provider. Make sure you discuss any questions you have with your health care provider. °Document Released: 04/17/2017 Document Revised: 04/17/2017 Document Reviewed: 04/17/2017 °Elsevier Interactive Patient Education © 2018 [employer] ° °

## 2018-05-26 NOTE — MAU Note (Signed)
I have communicated with Dr. Langston MaskerMorris and reviewed vital signs:  Vitals:   05/25/18 2237  BP: 107/62  Pulse: 95  Resp: 20  Temp: 98.2 F (36.8 C)    Vaginal exam:  Dilation: Closed Effacement (%): 60, 50 Cervical Position: Posterior Exam by:: Latricia HeftAnna Latiffany Harwick, RN,   Also reviewed contraction pattern and that non-stress test is reactive.  It has been documented that patient is contracting every 3-4 minutes with no cervical change over 1.5 hours not indicating active labor.  Patient denies any other complaints.  Based on this report provider has given order for discharge.  A discharge order and diagnosis entered by a provider.   Labor discharge instructions reviewed with patient.

## 2018-06-04 ENCOUNTER — Telehealth (HOSPITAL_COMMUNITY): Payer: Self-pay | Admitting: *Deleted

## 2018-06-04 ENCOUNTER — Encounter (HOSPITAL_COMMUNITY): Payer: Self-pay | Admitting: *Deleted

## 2018-06-04 NOTE — Telephone Encounter (Signed)
Preadmission screen  

## 2018-06-16 ENCOUNTER — Encounter (HOSPITAL_COMMUNITY): Payer: Self-pay

## 2018-06-16 ENCOUNTER — Inpatient Hospital Stay (HOSPITAL_COMMUNITY): Payer: 59 | Admitting: Anesthesiology

## 2018-06-16 ENCOUNTER — Inpatient Hospital Stay (HOSPITAL_COMMUNITY)
Admission: RE | Admit: 2018-06-16 | Discharge: 2018-06-18 | DRG: 768 | Disposition: A | Discharge status: Home / Self Care | Payer: 59 | Attending: Obstetrics & Gynecology | Admitting: Obstetrics & Gynecology

## 2018-06-16 VITALS — BP 105/69 | HR 94 | Temp 98.0°F | Resp 17 | Ht 67.0 in | Wt 192.3 lb

## 2018-06-16 DIAGNOSIS — Z3A39 39 weeks gestation of pregnancy: Secondary | ICD-10-CM | POA: Diagnosis not present

## 2018-06-16 DIAGNOSIS — Z3490 Encounter for supervision of normal pregnancy, unspecified, unspecified trimester: Secondary | ICD-10-CM

## 2018-06-16 DIAGNOSIS — Z3483 Encounter for supervision of other normal pregnancy, third trimester: Secondary | ICD-10-CM | POA: Diagnosis present

## 2018-06-16 LAB — CBC
HCT: 38 % (ref 36.0–46.0)
Hemoglobin: 12.6 g/dL (ref 12.0–15.0)
MCH: 29.1 pg (ref 26.0–34.0)
MCHC: 33.2 g/dL (ref 30.0–36.0)
MCV: 87.8 fL (ref 78.0–100.0)
PLATELETS: 158 10*3/uL (ref 150–400)
RBC: 4.33 MIL/uL (ref 3.87–5.11)
RDW: 19.2 % — ABNORMAL HIGH (ref 11.5–15.5)
WBC: 11 10*3/uL — AB (ref 4.0–10.5)

## 2018-06-16 LAB — TYPE AND SCREEN
ABO/RH(D): O POS
ANTIBODY SCREEN: NEGATIVE

## 2018-06-16 LAB — OB RESULTS CONSOLE GBS: GBS: NEGATIVE

## 2018-06-16 LAB — RPR: RPR: NONREACTIVE

## 2018-06-16 MED ORDER — ZOLPIDEM TARTRATE 5 MG PO TABS: 5.0000 mg | ORAL_TABLET | Freq: Every evening | ORAL | Status: DC | PRN

## 2018-06-16 MED ORDER — MISOPROSTOL 25 MCG QUARTER TABLET
25.0000 ug | ORAL_TABLET | ORAL | Status: DC | PRN
  Administered 2018-06-16 (×2): 25 ug via VAGINAL
  Filled 2018-06-16 (×3): qty 1

## 2018-06-16 MED ORDER — DIPHENHYDRAMINE HCL 50 MG/ML IJ SOLN: 12.5000 mg | INTRAMUSCULAR | Status: DC | PRN

## 2018-06-16 MED ORDER — ONDANSETRON HCL 4 MG PO TABS: 4.0000 mg | ORAL_TABLET | ORAL | Status: DC | PRN

## 2018-06-16 MED ORDER — FENTANYL CITRATE (PF) 100 MCG/2ML IJ SOLN: 50.0000 ug | INTRAMUSCULAR | Status: DC | PRN

## 2018-06-16 MED ORDER — ACETAMINOPHEN 325 MG PO TABS: 650.0000 mg | ORAL_TABLET | ORAL | Status: DC | PRN

## 2018-06-16 MED ORDER — IBUPROFEN 600 MG PO TABS
600.0000 mg | ORAL_TABLET | Freq: Four times a day (QID) | ORAL | Status: DC
  Administered 2018-06-16 – 2018-06-18 (×7): 600 mg via ORAL
  Filled 2018-06-16 (×7): qty 1

## 2018-06-16 MED ORDER — OXYTOCIN BOLUS FROM INFUSION
500.0000 mL | Freq: Once | INTRAVENOUS | Status: AC
  Administered 2018-06-16: 500 mL via INTRAVENOUS

## 2018-06-16 MED ORDER — ACETAMINOPHEN 325 MG PO TABS
650.0000 mg | ORAL_TABLET | ORAL | Status: DC | PRN
  Administered 2018-06-16: 650 mg via ORAL
  Filled 2018-06-16 (×2): qty 2

## 2018-06-16 MED ORDER — FENTANYL 2.5 MCG/ML BUPIVACAINE 1/10 % EPIDURAL INFUSION (WH - ANES)
14.0000 mL/h | INTRAMUSCULAR | Status: DC | PRN
  Administered 2018-06-16: 14 mL/h via EPIDURAL
  Filled 2018-06-16: qty 100

## 2018-06-16 MED ORDER — FERROUS SULFATE 325 (65 FE) MG PO TABS
325.0000 mg | ORAL_TABLET | Freq: Two times a day (BID) | ORAL | Status: DC
  Administered 2018-06-17 – 2018-06-18 (×3): 325 mg via ORAL
  Filled 2018-06-16 (×4): qty 1

## 2018-06-16 MED ORDER — PHENYLEPHRINE 40 MCG/ML (10ML) SYRINGE FOR IV PUSH (FOR BLOOD PRESSURE SUPPORT)
80.0000 ug | PREFILLED_SYRINGE | INTRAVENOUS | Status: DC | PRN
  Administered 2018-06-16 (×2): 80 ug via INTRAVENOUS
  Filled 2018-06-16: qty 5

## 2018-06-16 MED ORDER — WITCH HAZEL-GLYCERIN EX PADS
1.0000 "application " | MEDICATED_PAD | CUTANEOUS | Status: DC | PRN
  Administered 2018-06-18: 1 via TOPICAL

## 2018-06-16 MED ORDER — TETANUS-DIPHTH-ACELL PERTUSSIS 5-2.5-18.5 LF-MCG/0.5 IM SUSP: 0.5000 mL | Freq: Once | INTRAMUSCULAR | Status: DC

## 2018-06-16 MED ORDER — DIPHENHYDRAMINE HCL 25 MG PO CAPS: 25.0000 mg | ORAL_CAPSULE | Freq: Four times a day (QID) | ORAL | Status: DC | PRN

## 2018-06-16 MED ORDER — SOD CITRATE-CITRIC ACID 500-334 MG/5ML PO SOLN: 30.0000 mL | ORAL | Status: DC | PRN

## 2018-06-16 MED ORDER — PHENYLEPHRINE 40 MCG/ML (10ML) SYRINGE FOR IV PUSH (FOR BLOOD PRESSURE SUPPORT)
80.0000 ug | PREFILLED_SYRINGE | INTRAVENOUS | Status: DC | PRN
  Filled 2018-06-16: qty 5
  Filled 2018-06-16: qty 10

## 2018-06-16 MED ORDER — TERBUTALINE SULFATE 1 MG/ML IJ SOLN
0.2500 mg | Freq: Once | INTRAMUSCULAR | Status: DC | PRN
  Filled 2018-06-16: qty 1

## 2018-06-16 MED ORDER — PRENATAL MULTIVITAMIN CH
1.0000 | ORAL_TABLET | Freq: Every day | ORAL | Status: DC
  Administered 2018-06-17 – 2018-06-18 (×2): 1 via ORAL
  Filled 2018-06-16 (×2): qty 1

## 2018-06-16 MED ORDER — LACTATED RINGERS IV SOLN
500.0000 mL | INTRAVENOUS | Status: DC | PRN
  Administered 2018-06-16: 1000 mL via INTRAVENOUS

## 2018-06-16 MED ORDER — SENNOSIDES-DOCUSATE SODIUM 8.6-50 MG PO TABS
2.0000 | ORAL_TABLET | ORAL | Status: DC
  Administered 2018-06-16 – 2018-06-17 (×2): 2 via ORAL
  Filled 2018-06-16 (×2): qty 2

## 2018-06-16 MED ORDER — EPHEDRINE 5 MG/ML INJ
10.0000 mg | INTRAVENOUS | Status: DC | PRN
  Filled 2018-06-16: qty 2

## 2018-06-16 MED ORDER — DIBUCAINE 1 % RE OINT: 1.0000 "application " | TOPICAL_OINTMENT | RECTAL | Status: DC | PRN

## 2018-06-16 MED ORDER — OXYTOCIN 40 UNITS IN LACTATED RINGERS INFUSION - SIMPLE MED
2.5000 [IU]/h | INTRAVENOUS | Status: DC
  Administered 2018-06-16: 2.5 [IU]/h via INTRAVENOUS
  Filled 2018-06-16: qty 1000

## 2018-06-16 MED ORDER — OXYCODONE-ACETAMINOPHEN 5-325 MG PO TABS: 2.0000 | ORAL_TABLET | ORAL | Status: DC | PRN

## 2018-06-16 MED ORDER — OXYCODONE-ACETAMINOPHEN 5-325 MG PO TABS: 1.0000 | ORAL_TABLET | ORAL | Status: DC | PRN

## 2018-06-16 MED ORDER — LACTATED RINGERS IV SOLN
INTRAVENOUS | Status: DC
  Administered 2018-06-16 (×4): via INTRAVENOUS

## 2018-06-16 MED ORDER — SIMETHICONE 80 MG PO CHEW: 80.0000 mg | CHEWABLE_TABLET | ORAL | Status: DC | PRN

## 2018-06-16 MED ORDER — OXYTOCIN 40 UNITS IN LACTATED RINGERS INFUSION - SIMPLE MED
1.0000 m[IU]/min | INTRAVENOUS | Status: DC
Start: 2018-06-16 — End: 2018-06-16
  Administered 2018-06-16: 2 m[IU]/min via INTRAVENOUS

## 2018-06-16 MED ORDER — LIDOCAINE HCL (PF) 1 % IJ SOLN
30.0000 mL | INTRAMUSCULAR | Status: DC | PRN
  Filled 2018-06-16: qty 30

## 2018-06-16 MED ORDER — BENZOCAINE-MENTHOL 20-0.5 % EX AERO
1.0000 "application " | INHALATION_SPRAY | CUTANEOUS | Status: DC | PRN
  Administered 2018-06-18: 1 via TOPICAL
  Filled 2018-06-16 (×2): qty 56

## 2018-06-16 MED ORDER — ONDANSETRON HCL 4 MG/2ML IJ SOLN: 4.0000 mg | Freq: Four times a day (QID) | INTRAMUSCULAR | Status: DC | PRN

## 2018-06-16 MED ORDER — LACTATED RINGERS IV SOLN
500.0000 mL | Freq: Once | INTRAVENOUS | Status: AC
  Administered 2018-06-16: 1000 mL via INTRAVENOUS

## 2018-06-16 MED ORDER — COCONUT OIL OIL: 1.0000 "application " | TOPICAL_OIL | Status: DC | PRN

## 2018-06-16 MED ORDER — ONDANSETRON HCL 4 MG/2ML IJ SOLN: 4.0000 mg | INTRAMUSCULAR | Status: DC | PRN

## 2018-06-16 MED ORDER — FLEET ENEMA 7-19 GM/118ML RE ENEM: 1.0000 | ENEMA | RECTAL | Status: DC | PRN

## 2018-06-16 MED ORDER — LIDOCAINE HCL (PF) 1 % IJ SOLN
INTRAMUSCULAR | Status: DC | PRN
  Administered 2018-06-16: 4 mL via EPIDURAL
  Administered 2018-06-16: 6 mL via EPIDURAL

## 2018-06-16 NOTE — Anesthesia Pain Management Evaluation Note (Signed)
  CRNA Pain Management Visit Note  Patient: Anna Brewer, 32 y.o., female  "Hello I am a member of the anesthesia team at [facility]. We have an anesthesia team available at all times to provide care throughout the hospital, including epidural management and anesthesia for C-section. I don't know your plan for the delivery whether it a natural birth, water birth, IV sedation, nitrous supplementation, doula or epidural, but we want to meet your pain goals."   1.Was your pain managed to your expectations on prior hospitalizations?   Yes   2.What is your expectation for pain management during this hospitalization?     Epidural  3.How can we help you reach that goal? unsure  Record the patient's initial score and the patient's pain goal.   Pain: 4  Pain Goal: 6 [facility] wants you to be able to say your pain was always managed very well.  Cephus ShellingBURGER,[name] 06/16/2018

## 2018-06-16 NOTE — Anesthesia Pain Management Evaluation Note (Signed)
  CRNA Pain Management Visit Note  Patient: Anna Brewer, 32 y.o., female  "Hello I am a member of the anesthesia team at [facility]. We have an anesthesia team available at all times to provide care throughout the hospital, including epidural management and anesthesia for C-section. I don't know your plan for the delivery whether it a natural birth, water birth, IV sedation, nitrous supplementation, doula or epidural, but we want to meet your pain goals."   1.Was your pain managed to your expectations on prior hospitalizations?   Unable to assess - patient sleeping  2.What is your expectation for pain management during this hospitalization?     Epidural, IV pain meds and Nitrous Oxide  3.How can we help you reach that goal? Re check, be available  Record the patient's initial score and the patient's pain goal.   Pain: 1  Pain Goal: 5 [facility] wants you to be able to say your pain was always managed very well.  [facility],Namrata Dangler 06/16/2018

## 2018-06-16 NOTE — Anesthesia Procedure Notes (Signed)
Epidural Patient location during procedure: OB Start time: 06/16/2018 1:31 PM End time: 06/16/2018 1:35 PM  Staffing Anesthesiologist: Beryle LatheBrock, Yanelle Sousa E, MD Performed: anesthesiologist   Preanesthetic Checklist Completed: patient identified, pre-op evaluation, timeout performed, IV checked, risks and benefits discussed and monitors and equipment checked  Epidural Patient position: sitting Prep: DuraPrep Patient monitoring: continuous pulse ox and blood pressure Approach: midline Location: L2-L3 Injection technique: LOR saline  Needle:  Needle type: Tuohy  Needle gauge: 17 G Needle length: 9 cm Needle insertion depth: 5 cm Catheter size: 19 Gauge Catheter at skin depth: 11 cm Test dose: negative and Other (1% lidocaine)  Additional Notes Patient identified. Risks including, but not limited to, bleeding, infection, nerve damage, paralysis, inadequate analgesia, blood pressure changes, nausea, vomiting, allergic reaction, postpartum back pain, itching, and headache were discussed. Patient expressed understanding and wished to proceed. Sterile prep and drape, including hand hygiene, mask, and sterile gloves were used. The patient was positioned and the spine was prepped. The skin was anesthetized with lidocaine. No paraesthesia or other complication noted. The patient did not experience any signs of intravascular injection such as tinnitus or metallic taste in mouth, nor signs of intrathecal spread such as rapid motor block. Please see nursing notes for vital signs. The patient tolerated the procedure well.   Leslye Peerhomas Artisha Capri, MDReason for block:procedure for pain

## 2018-06-16 NOTE — Anesthesia Preprocedure Evaluation (Signed)
Anesthesia Evaluation  Patient identified by MRN, date of birth, ID band Patient awake    Reviewed: Allergy & Precautions, H&P , NPO status , Patient's Chart, lab work & pertinent test results  History of Anesthesia Complications Negative for: history of anesthetic complications  Airway Mallampati: II  TM Distance: >3 FB Neck ROM: Full    Dental no notable dental hx.    Pulmonary asthma ,    Pulmonary exam normal breath sounds clear to auscultation       Cardiovascular negative cardio ROS Normal cardiovascular exam Rhythm:Regular Rate:Normal     Neuro/Psych  Headaches, negative psych ROS   GI/Hepatic Neg liver ROS, GERD  ,  Endo/Other  negative endocrine ROS  Renal/GU negative Renal ROS  negative genitourinary   Musculoskeletal negative musculoskeletal ROS (+)   Abdominal   Peds  Hematology negative hematology ROS (+)   Anesthesia Other Findings   Reproductive/Obstetrics (+) Pregnancy                             Anesthesia Physical  Anesthesia Plan  ASA: II  Anesthesia Plan: Epidural   Post-op Pain Management:    Induction:   PONV Risk Score and Plan:   Airway Management Planned: Natural Airway  Additional Equipment: None  Intra-op Plan:   Post-operative Plan:   Informed Consent: I have reviewed the patients History and Physical, chart, labs and discussed the procedure including the risks, benefits and alternatives for the proposed anesthesia with the patient or authorized representative who has indicated his/her understanding and acceptance.     Plan Discussed with: Anesthesiologist  Anesthesia Plan Comments: (Labs reviewed. Platelets acceptable, patient not taking any blood thinning medications. Risks and benefits discussed with patient, patient expressed understanding and wished to proceed.)        Anesthesia Quick Evaluation

## 2018-06-16 NOTE — Progress Notes (Signed)
CVX 2/th/hi Start pitocin  Mitchel HonourMegan Wasil Wolke, DO

## 2018-06-16 NOTE — H&P (Signed)
Anna Brewer is a 32 y.o. female presenting for elective IOL.  The patient has h/o PPROM/PTD at 32 weeks.  She underwent hysteroscopic removal of uterine septum with Dr. April Brewer prior to current pregnancy and has received weekly 17-P.  She has had anemia this pregnancy and has received Fe infusions x 2; current Hgb 12.6.  GBS negative.  The patient received her second dose of VMP at 0520. She is feeling mild CTX.    OB History    Gravida  2   Para  1   Term  0   Preterm  1   AB  0   Living  1     SAB  0   TAB  0   Ectopic  0   Multiple  0   Live Births  1          Past Medical History:  Diagnosis Date  . GERD (gastroesophageal reflux disease)   . Migraine   . Mild intermittent asthma   . Wears glasses    Past Surgical History:  Procedure Laterality Date  . HYSTEROSCOPY N/A 08/27/2016   Procedure: HYSTEROSCOPY excision of uterine septum;  Surgeon: Anna Schwabamer Yalcinkaya, MD;  [facility] [place];  Service: Gynecology;  Laterality: N/A;  . TONSILLECTOMY  2007  . WISDOM TOOTH EXTRACTION     Family History: family history includes Cancer in her maternal grandfather and paternal grandfather; Hypertension in her mother. Social History:  reports that she has never smoked. She has never used smokeless tobacco. She reports that she does not drink alcohol or use drugs.     Maternal Diabetes: No Genetic Screening: Normal Maternal Ultrasounds/Referrals: Normal Fetal Ultrasounds or other Referrals:  None Maternal Substance Abuse:  No Significant Maternal Medications:  Meds include: Other: 17-P Significant Maternal Lab Results:  Lab values include: Group B Strep negative Other Comments:  None  ROS Maternal Medical History:  Fetal activity: Perceived fetal activity is normal.   Last perceived fetal movement was within the past hour.    Prenatal complications: no prenatal complications Prenatal Complications - Diabetes: none.    Dilation:  2 Effacement (%): 30 Station: -3 Exam by:: Anna Bienenstockiana Castillo, RN Blood pressure 104/71, pulse 85, temperature 98.2 F (36.8 C), temperature source Oral, resp. rate 18, height 5\' 7"  (1.702 m), weight 192 lb 4.8 oz (87.2 kg), currently breastfeeding. Maternal Exam:  Uterine Assessment: Contraction strength is mild.  Contraction frequency is irregular.   Abdomen: Patient reports no abdominal tenderness. Fundal height is c/w dates.   Estimated fetal weight is 8#4.       Physical Exam  Constitutional: She is oriented to person, place, and time. She appears well-developed and well-nourished.  GI: Soft. There is no rebound and no guarding.  Neurological: She is alert and oriented to person, place, and time.  Skin: Skin is warm and dry.  Psychiatric: She has a normal mood and affect. Her behavior is normal.    Prenatal labs: ABO, Rh: --/--/O POS (07/02 0107) Antibody: NEG (07/02 0107) Rubella: Immune (12/12 0000) RPR: Nonreactive (12/12 0000)  HBsAg: Negative (12/12 0000)  HIV: Non-reactive (12/12 0000)  GBS: Negative (07/02 0000)   Assessment/Plan: 31yo G2P0101 at 4734w5d for IOL -Check cvx at 0920 and AROM if able -Epidural if desired -Anticipate NSVD   Anna Brewer 06/16/2018, 8:18 AM

## 2018-06-17 ENCOUNTER — Other Ambulatory Visit: Payer: Self-pay

## 2018-06-17 LAB — CBC
HEMATOCRIT: 30.7 % — AB (ref 36.0–46.0)
HEMOGLOBIN: 10.1 g/dL — AB (ref 12.0–15.0)
MCH: 29.4 pg (ref 26.0–34.0)
MCHC: 32.9 g/dL (ref 30.0–36.0)
MCV: 89.2 fL (ref 78.0–100.0)
Platelets: 132 10*3/uL — ABNORMAL LOW (ref 150–400)
RBC: 3.44 MIL/uL — ABNORMAL LOW (ref 3.87–5.11)
RDW: 19.3 % — ABNORMAL HIGH (ref 11.5–15.5)
WBC: 16.4 10*3/uL — ABNORMAL HIGH (ref 4.0–10.5)

## 2018-06-17 NOTE — Lactation Note (Signed)
This note was copied from a baby's chart. Lactation Consultation Note  Patient Name: Anna Brewer Reason for consult: Initial assessment;Term  P2 mother whose infant is now 309 hours old.  Mother breastfed and pumped for her first child who was born at 7332 weeks and spent 6 weeks in the NICU.    Baby is in bassinet showing feeding cues.  Parents state that they have tried 3 times within the last hour to latch her and every time she gets to breast she falls asleep.  I offered to assist with latch and mother accepted.  Mother's breasts are soft and non tender with everted nipples.  I suggested the football hold and infant latched onto the left breast without difficulty.  Constant stimulation required to keep her awake and sucking at the breast.  Demonstrated ways to keep her awake and suggested mother do breast compressions with feedings.    Encouraged to feed 8-12 times/24 hours or more if baby shows feeding cues.  Mother is familiar with cues.  Continue STS, breast massage and hand expression after feeds.  Mom made aware of O/P services, breastfeeding support groups, community resources, and our phone # for post-discharge questions. Mother will call for assistance as needed.  Father present.     Maternal Data Formula Feeding for Exclusion: No Has patient been taught Hand Expression?: Yes Does the patient have breastfeeding experience prior to this delivery?: Yes  Feeding Feeding Type: Breast Fed Length of feed: 12 min(still feeding when I left the room)  LATCH Score Latch: Repeated attempts needed to sustain latch, nipple held in mouth throughout feeding, stimulation needed to elicit sucking reflex.  Audible Swallowing: A few with stimulation  Type of Nipple: Everted at rest and after stimulation  Comfort (Breast/Nipple): Soft / non-tender  Hold (Positioning): Assistance needed to correctly position infant at breast and maintain latch.  LATCH Score:  7  Interventions Interventions: Breast feeding basics reviewed;Assisted with latch;Skin to skin;Breast massage;Position options;Support pillows;Adjust position;Breast compression  Lactation Tools Discussed/Used WIC Program: No   Consult Status Consult Status: Follow-up Date: 06/18/18 Follow-up type: In-patient    Menna Abeln R Thelma Viana Brewer, 4:05 AM

## 2018-06-17 NOTE — Progress Notes (Signed)
Post Partum Day 1 Subjective: no complaints, up ad lib, voiding and tolerating PO  Objective: Blood pressure 109/69, pulse 81, temperature 98.4 F (36.9 C), temperature source Oral, resp. rate 18, height 5\' 7"  (1.702 m), weight 192 lb 4.8 oz (87.2 kg), SpO2 99 %, unknown if currently breastfeeding.  Physical Exam:  General: alert, cooperative, appears stated age and no distress Lochia: appropriate Uterine Fundus: firm Incision: healing well DVT Evaluation: No evidence of DVT seen on physical exam.  Recent Labs    06/16/18 0107 06/17/18 0606  HGB 12.6 10.1*  HCT 38.0 30.7*    Assessment/Plan: Plan for discharge tomorrow and Breastfeeding   LOS: 1 day   Anna Brewer C 06/17/2018, 9:36 AM

## 2018-06-17 NOTE — Anesthesia Postprocedure Evaluation (Signed)
Anesthesia Post Note  Patient: Anna Brewer  Procedure(s) Performed: AN AD HOC LABOR EPIDURAL     Patient location during evaluation: Mother Baby Anesthesia Type: Epidural Level of consciousness: awake Pain management: pain level controlled Vital Signs Assessment: post-procedure vital signs reviewed and stable Respiratory status: spontaneous breathing Cardiovascular status: stable Postop Assessment: no headache, no backache, epidural receding, patient able to bend at knees, no apparent nausea or vomiting, adequate PO intake and able to ambulate Anesthetic complications: no    Last Vitals:  Vitals:   06/16/18 2231 06/17/18 0553  BP: 117/72 109/69  Pulse: 79 81  Resp: 18 18  Temp: 37 C 36.9 C  SpO2:      Last Pain:  Vitals:   06/17/18 0553  TempSrc: Oral  PainSc: 3    Pain Goal: Patients Stated Pain Goal: 6 (06/16/18 0810)               Fanny DanceMULLINS,[name]

## 2018-06-17 NOTE — Lactation Note (Signed)
This note was copied from a baby's chart. Lactation Consultation Note  Patient Name: Girl Manus RuddSara Murdy ZOXWR'UToday's Date: 06/17/2018   Visited with P2 Mom of 17 hr old baby.  Mom states baby is breastfeeding well, and denies any discomfort.  Baby was lying on GMOB, and started to cry.  Mom communicated she didn't wish for any assistance with latching, that she is fine latching baby. Encouraged Mom to call prn for assistance.  Judee ClaraSmith, Cayle Thunder E 06/17/2018, 12:32 PM

## 2018-06-18 NOTE — Discharge Summary (Signed)
Obstetric Discharge Summary Reason for Admission: induction of labor Prenatal Procedures: none Intrapartum Procedures: spontaneous vaginal delivery Postpartum Procedures: none Complications-Operative and Postpartum: 3rd degree perineal laceration Hemoglobin  Date Value Ref Range Status  06/17/2018 10.1 (L) 12.0 - 15.0 g/dL Final   HCT  Date Value Ref Range Status  06/17/2018 30.7 (L) 36.0 - 46.0 % Final    Physical Exam:  General: alert, cooperative and appears stated age 28Lochia: appropriate Uterine Fundus: firm Incision: healing well, no significant drainage, no dehiscence DVT Evaluation: No evidence of DVT seen on physical exam.  Discharge Diagnoses: Term Pregnancy-delivered  Discharge Information: Date: 06/18/2018 Activity: pelvic rest Diet: routine Medications: None Condition: improved Instructions: refer to practice specific booklet Discharge to: home   Newborn Data: Live born female  Birth Weight: 8 lb 15.6 oz (4070 g) APGAR: 8, 9  Newborn Delivery   Birth date/time:  06/16/2018 18:36:00 Delivery type:  Vaginal, Spontaneous     Home with mother.  Robynn Marcel L 06/18/2018, 9:04 AM

## 2018-06-18 NOTE — Lactation Note (Signed)
This note was copied from a baby's chart. Lactation Consultation Note  Patient Name: Anna Brewer YNWGN'FToday's Date: 06/18/2018 Reason for consult: Follow-up assessment;Hyperbilirubinemia  Visited with P2 Mom of term baby at 1043 hrs old.  Baby started on single phototherapy last evening, increasing to double this am.  Bili 11.7 this am.  One stool after delivery in birthing suites, and none since.  3 voids in last 24 hrs.   Assisted Mom with positioning baby on left breast in football hold.  Added pillow support, and breast support to assist baby attain a deeper latch to breast.  Swallow pattern identified for Mom. At end of feeding, baby slipped onto nipple, and nipple looked pinched with some blistering on tip.  Baby switched to right breast after 15 mins, in cross cradle hold (Mom was going to use cradle), and baby able to attain another deep areolar grasp.  Mom denies any discomfort. Again encouraged alternate breast compression to increase milk transfer.  Plan- 1- Breastfeed at least every 3 hrs or sooner on cue, skin to skin making sure baby is latched deeply.  Mom to use alternate breast compression to increase milk transfer. 2-Pump both breasts 15 mins, along with breast massage and hand expression. 3-Feed baby any EBM back to baby.   4- ask for help prn.  Feeding Feeding Type: Breast Fed Length of feed: (still breast feeding when LC left room)  LATCH Score Latch: Grasps breast easily, tongue down, lips flanged, rhythmical sucking.  Audible Swallowing: Spontaneous and intermittent  Type of Nipple: Everted at rest and after stimulation  Comfort (Breast/Nipple): Filling, red/small blisters or bruises, mild/mod discomfort  Hold (Positioning): No assistance needed to correctly position infant at breast.  LATCH Score: 9  Interventions Interventions: Breast feeding basics reviewed;Assisted with latch;Skin to skin;Breast massage;Hand express;Expressed milk;Position options;Support  pillows;Adjust position;Breast compression;DEBP  Lactation Tools Discussed/Used Tools: Pump Breast pump type: Double-Electric Breast Pump WIC Program: No Pump Review: Setup, frequency, and cleaning;Milk Storage Initiated by:: Erby Pian Arrow Tomko RN IBCLC Date initiated:: 06/18/18   Consult Status Consult Status: Follow-up Date: 06/19/18 Follow-up type: In-patient    Anna Brewer, Anna Brewer 06/18/2018, 2:22 PM

## 2018-06-19 ENCOUNTER — Ambulatory Visit: Payer: Self-pay

## 2018-06-19 NOTE — Lactation Note (Signed)
This note was copied from a baby's chart. Lactation Consultation Note  Patient Name: Girl Manus RuddSara Trudel ZOXWR'UToday's Date: 06/19/2018   Baby 61 hours old and on phototherapy.  Ex BF. Infant has not had stool since 7/2.  Mother is breastfeeding on both breasts and offering supplemental breastmilk after feedings. Offered to assist w/ latch and mother states she has already worked with lactation and feels confident about her latch. Mother is pumping approx 10 ml. Mom encouraged to feed baby 8-12 times/24 hours and with feeding cues.  Discussed waking baby for feedings if needed since baby is jaundiced.   Reviewed engorgement care and monitoring voids/stools. Denies questions or concerns.       Maternal Data    Feeding Feeding Type: Breast Milk Length of feed: 15 min  LATCH Score                   Interventions    Lactation Tools Discussed/Used     Consult Status      Hardie PulleyBerkelhammer, Ruth Boschen 06/19/2018, 7:45 AM

## 2018-06-19 NOTE — Lactation Note (Signed)
This note was copied from a baby's chart. Lactation Consultation Note  Patient Name: Anna Brewer Today's Date: 06/19/2018  67 hours female infant who was on  Phototherapy; infant had two stools since this morning.  LC entered room mom is engorged both breast. Ice was used with  breast massage  Mom laying flat on bed. Mom reported a lot of pumping after every feeding  will limit pumping 4 times per day within 24 hours only pump 5 to 7 minutes for comfort  and put baby to breast according hunger cues.     Maternal Data    Feeding    LATCH Score                   Interventions    Lactation Tools Discussed/Used     Consult Status      Anna Brewer 06/19/2018, 1:45 PM

## 2020-08-15 LAB — OB RESULTS CONSOLE HIV ANTIBODY (ROUTINE TESTING): HIV: NONREACTIVE

## 2020-08-15 LAB — OB RESULTS CONSOLE GC/CHLAMYDIA
Chlamydia: NEGATIVE
Gonorrhea: NEGATIVE

## 2020-08-15 LAB — OB RESULTS CONSOLE RPR: RPR: NONREACTIVE

## 2020-08-15 LAB — OB RESULTS CONSOLE HEPATITIS B SURFACE ANTIGEN: Hepatitis B Surface Ag: NEGATIVE

## 2020-08-15 LAB — OB RESULTS CONSOLE ABO/RH: RH Type: POSITIVE

## 2020-08-15 LAB — HEPATITIS C ANTIBODY: HCV Ab: NEGATIVE

## 2020-08-15 LAB — OB RESULTS CONSOLE RUBELLA ANTIBODY, IGM: Rubella: IMMUNE

## 2020-08-15 LAB — OB RESULTS CONSOLE ANTIBODY SCREEN: Antibody Screen: NEGATIVE

## 2020-11-18 ENCOUNTER — Other Ambulatory Visit: Payer: Self-pay

## 2020-11-18 ENCOUNTER — Inpatient Hospital Stay (HOSPITAL_COMMUNITY)
Admission: AD | Admit: 2020-11-18 | Discharge: 2020-11-18 | Disposition: A | Discharge status: Home / Self Care | Payer: Managed Care, Other (non HMO) | Attending: Obstetrics and Gynecology | Admitting: Obstetrics and Gynecology

## 2020-11-18 ENCOUNTER — Encounter (HOSPITAL_COMMUNITY): Payer: Self-pay | Admitting: Obstetrics and Gynecology

## 2020-11-18 ENCOUNTER — Inpatient Hospital Stay (HOSPITAL_COMMUNITY): Payer: Managed Care, Other (non HMO)

## 2020-11-18 DIAGNOSIS — R109 Unspecified abdominal pain: Secondary | ICD-10-CM

## 2020-11-18 DIAGNOSIS — R102 Pelvic and perineal pain: Secondary | ICD-10-CM | POA: Insufficient documentation

## 2020-11-18 DIAGNOSIS — O09212 Supervision of pregnancy with history of pre-term labor, second trimester: Secondary | ICD-10-CM | POA: Diagnosis not present

## 2020-11-18 DIAGNOSIS — R1031 Right lower quadrant pain: Secondary | ICD-10-CM | POA: Insufficient documentation

## 2020-11-18 DIAGNOSIS — O26892 Other specified pregnancy related conditions, second trimester: Secondary | ICD-10-CM | POA: Diagnosis present

## 2020-11-18 DIAGNOSIS — N949 Unspecified condition associated with female genital organs and menstrual cycle: Secondary | ICD-10-CM | POA: Diagnosis not present

## 2020-11-18 DIAGNOSIS — O99891 Other specified diseases and conditions complicating pregnancy: Secondary | ICD-10-CM | POA: Diagnosis not present

## 2020-11-18 DIAGNOSIS — Z8751 Personal history of pre-term labor: Secondary | ICD-10-CM | POA: Insufficient documentation

## 2020-11-18 DIAGNOSIS — Z3686 Encounter for antenatal screening for cervical length: Secondary | ICD-10-CM | POA: Insufficient documentation

## 2020-11-18 DIAGNOSIS — Z3A21 21 weeks gestation of pregnancy: Secondary | ICD-10-CM | POA: Diagnosis not present

## 2020-11-18 DIAGNOSIS — Z79899 Other long term (current) drug therapy: Secondary | ICD-10-CM | POA: Diagnosis not present

## 2020-11-18 LAB — URINALYSIS, ROUTINE W REFLEX MICROSCOPIC
Bilirubin Urine: NEGATIVE
Glucose, UA: NEGATIVE mg/dL
Hgb urine dipstick: NEGATIVE
Ketones, ur: NEGATIVE mg/dL
Nitrite: NEGATIVE
Protein, ur: NEGATIVE mg/dL
Specific Gravity, Urine: 1.009 (ref 1.005–1.030)
pH: 6 (ref 5.0–8.0)

## 2020-11-18 LAB — WET PREP, GENITAL
Clue Cells Wet Prep HPF POC: NONE SEEN
Sperm: NONE SEEN
Trich, Wet Prep: NONE SEEN
Yeast Wet Prep HPF POC: NONE SEEN

## 2020-11-18 IMAGING — US US MFM OB TRANSVAGINAL
2 series · 15 of 28 positions shown · non-contrast
Comparison: none

[Series 1: us mfm ob transvaginal · 33 acquisitions, 11 frames shown (1 of 2)]
[im 1/33]
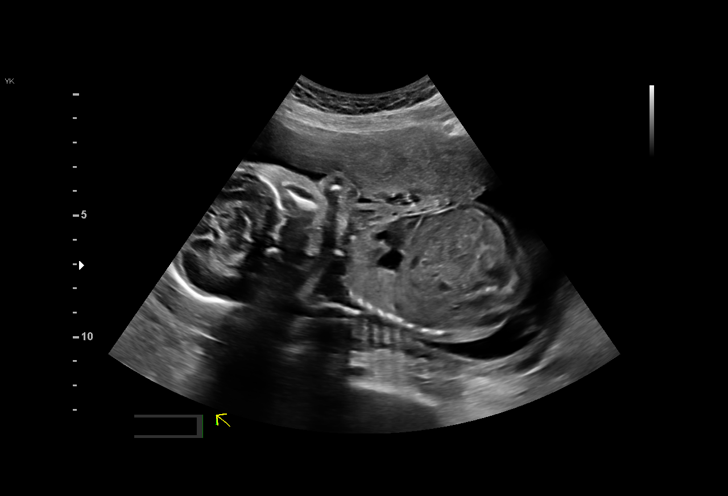
[im 4/33]
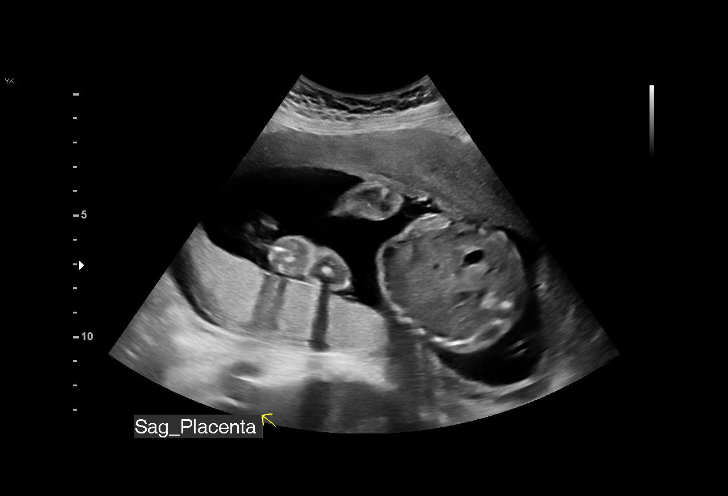
[im 7/33]
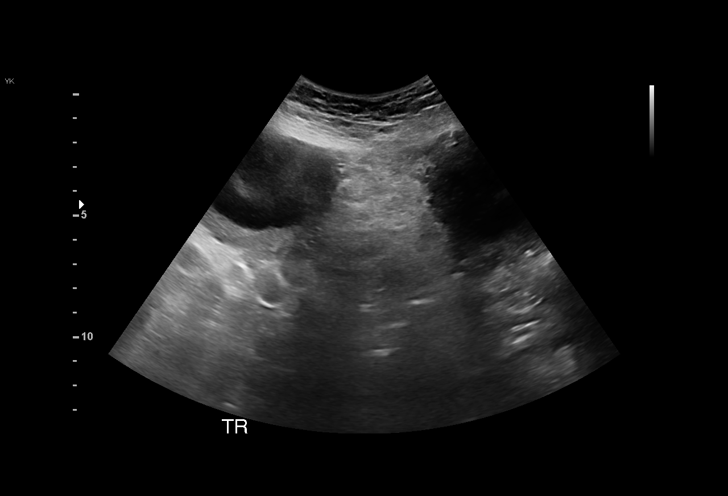
[im 10/33]
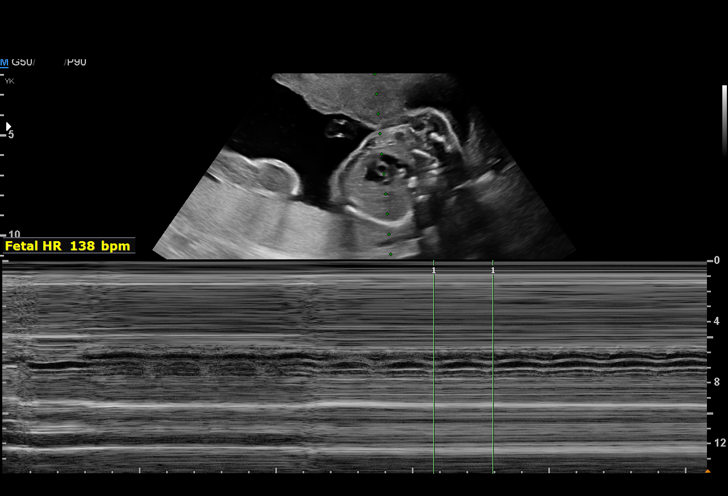
[im 13/33]
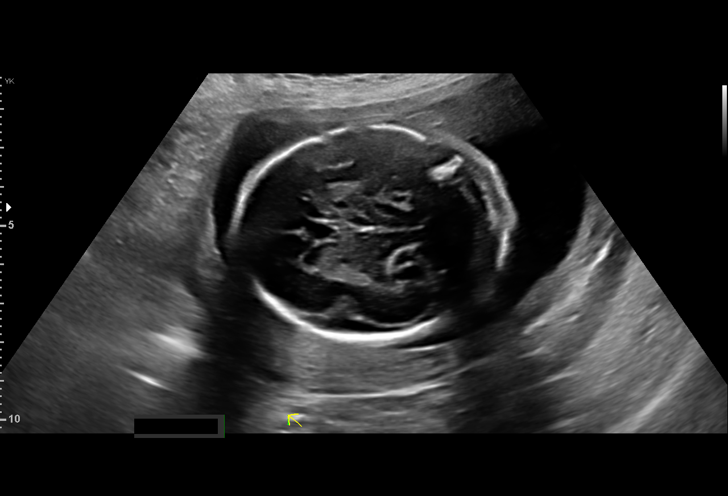
[im 17/33]
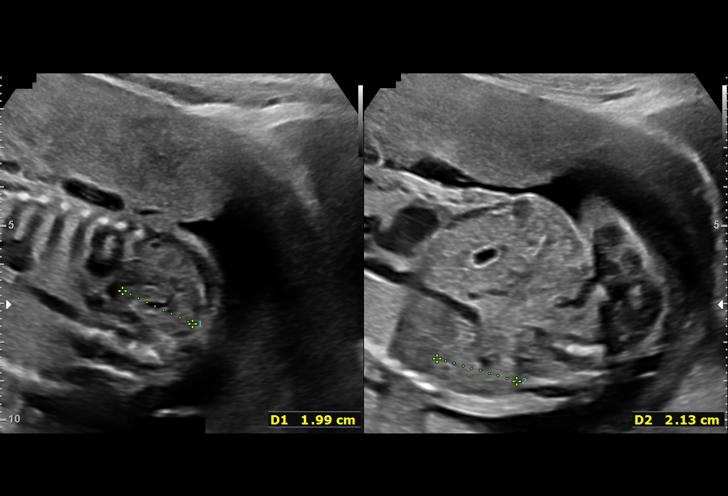
[im 20/33]
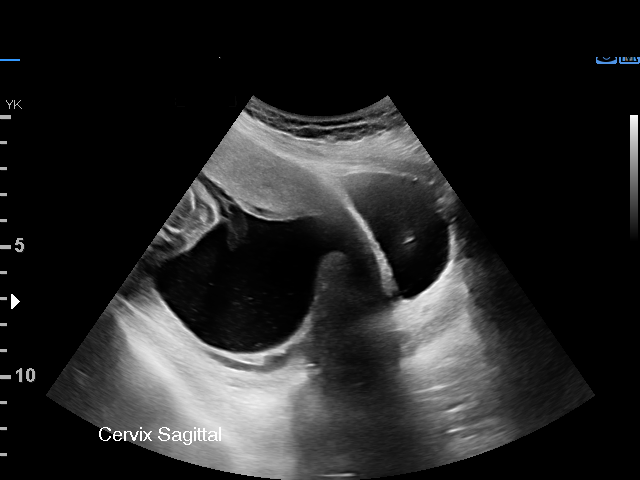
[im 23/33]
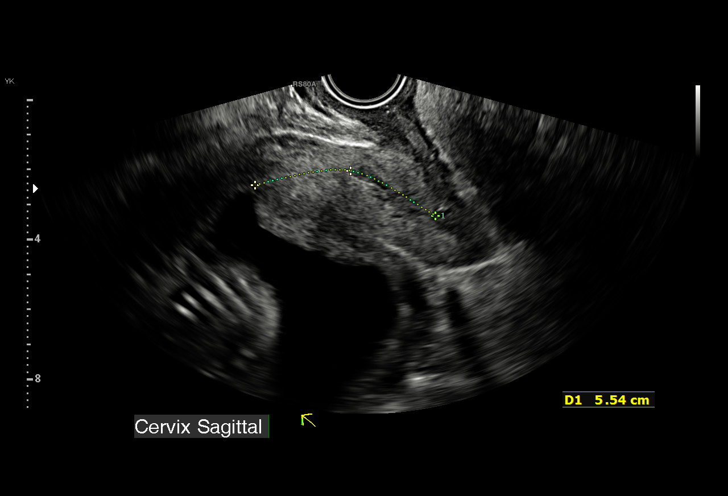
[im 25/33]
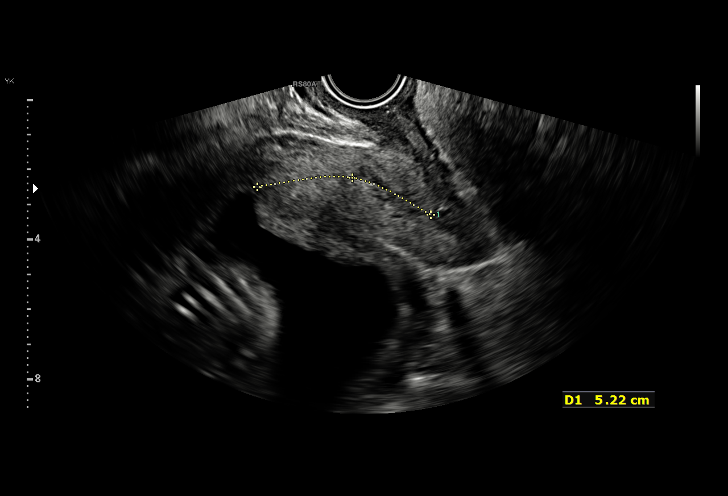
[im 28/33]
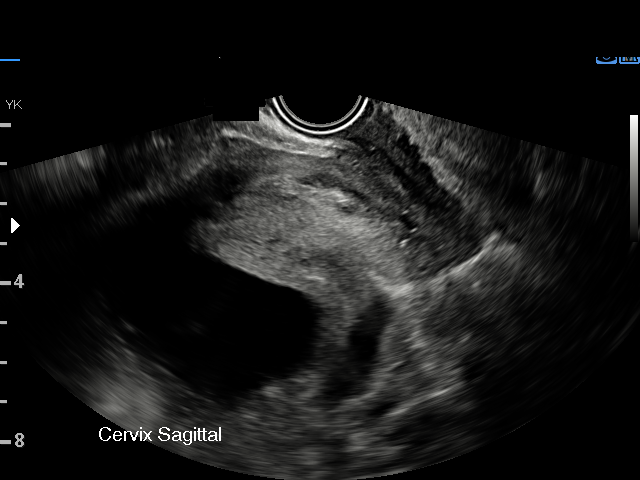
[im 31/33]
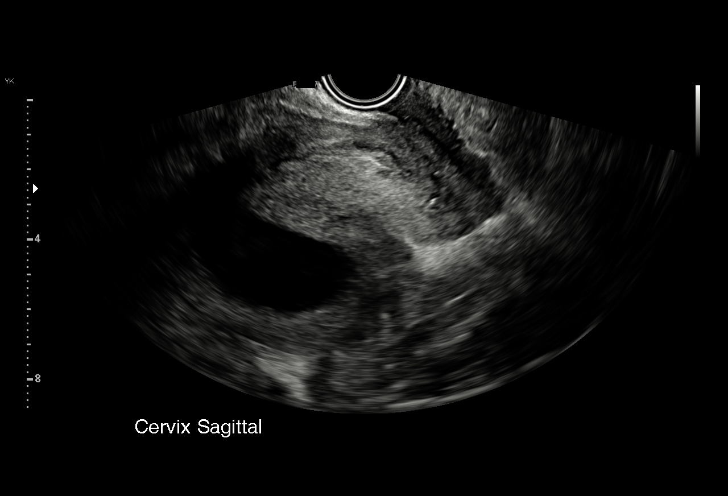

[Series 3: us mfm ob transvaginal · 4 of 12 slices shown (2 of 2)]
[im 1/12]
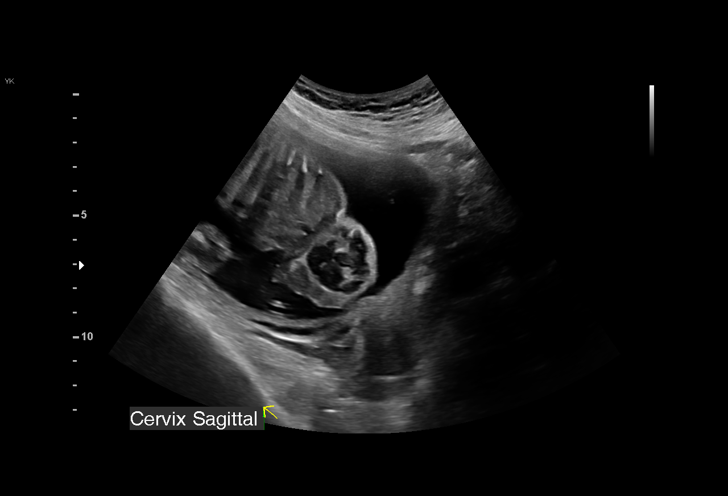
[im 4/12]
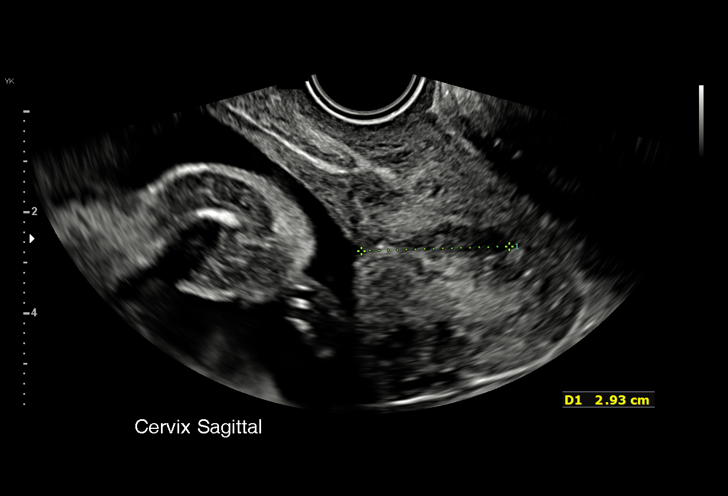
[im 8/12]
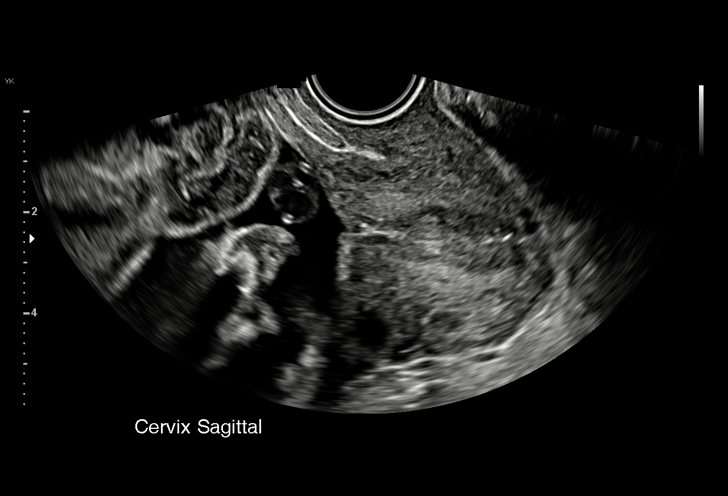
[im 12/12]
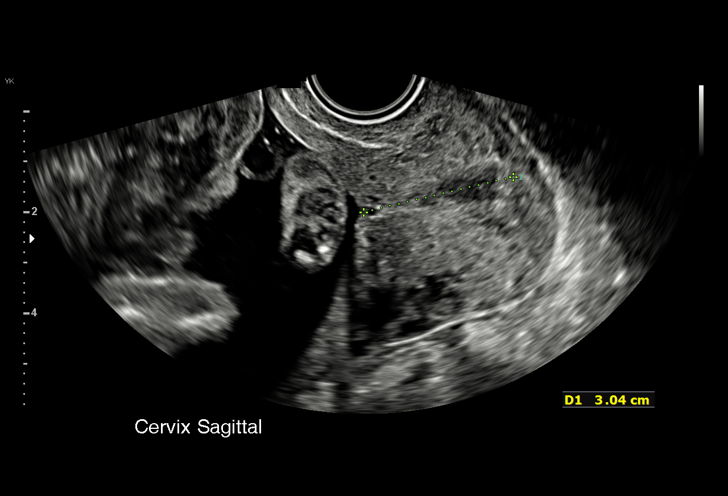

[15 of 28 positions shown; findings below may reference images not displayed]

Attending:        Ezeudu Tasiu        Secondary Phy.:   [facility]/Triage

 1  US MFM OB TRANSVAGINAL                76817.2     MATSUZAWA MORI

Indications

 Encounter for cervical length
 History of pre-term delivery
 Abdominal pain in pregnancy
 21 weeks gestation of pregnancy
Fetal Evaluation

 Num Of Fetuses:         1
 Fetal Heart Rate(bpm):  138
 Cardiac Activity:       Observed
 Presentation:           Breech
 Placenta:               Posterior

 Amniotic Fluid
 AFI FV:      Subjectively upper-normal

                             Largest Pocket(cm)

OB History

 Gravidity:    3         Term:   1        Prem:   1
 Living:       2
Gestational Age

 LMP:           21w 5d        Date:  06/19/20                 EDD:   03/26/21
 Best:          21w 5d     Det. By:  LMP  (06/19/20)          EDD:   03/26/21
Anatomy

 Cranium:               Appears normal         Abdominal Wall:         Appears nml (cord
                                                                       insert, abd wall)
 Cavum:                 Appears normal         Cord Vessels:           Appears normal (3
                                                                       vessel cord)
 Stomach:               Appears normal, left   Kidneys:                Appear normal
                        sided
 Abdomen:               Appears normal         Bladder:                Appears normal
Cervix Uterus Adnexa

 Cervix
 Length:            3.3  cm.
 Normal appearance by transvaginal scan,

 Uterus
 Arcuate.

 Cul De Sac
 No free fluid seen.
Impression

 Patient is being evaluated at the [facility] for c/o abdominal pain.
 History of preterm deliveries.
 Amniotic fluid is normal and good fetal activity is seen. On
 transvaginal scan, the cervix measures 3.3 cm, which is
 within normal limits.
 Discussed with Bareen, Nesar Ahmad.
                 Marlow, Faraz

## 2020-11-18 MED ORDER — CYCLOBENZAPRINE HCL 5 MG PO TABS: 5.0000 mg | ORAL_TABLET | Freq: Three times a day (TID) | ORAL | 0 refills | Status: DC | PRN

## 2020-11-18 MED ORDER — COMFORT FIT MATERNITY SUPP SM MISC: 1.0000 [IU] | Freq: Every day | 0 refills | Status: DC | PRN

## 2020-11-18 NOTE — MAU Note (Signed)
Anna Brewer is a 34 y.o. at [redacted]w[redacted]d here in MAU reporting: having lower right sided pain, states it is sharp. States it has been more consistent today. Pain is intermittent.   Onset of complaint: worse today  Pain score: 3/10  FHT:146  Lab orders placed from triage: UA

## 2020-11-18 NOTE — MAU Provider Note (Signed)
History     CSN: [id number]  Arrival date and time: 11/18/20 1552   First Provider Initiated Contact with Patient 11/18/20 1714      Chief Complaint  Patient presents with  . Abdominal Pain   Ms. Anna Brewer is a 34 y.o. [phone] at 4785w5d who presents to MAU for sharp RLQ pain. Patient reports the pain started several weeks ago, but it was previously intermittent and occurring only about 2 times per day, but today the pain is almost constant and is sometimes sharp and then also feels sore afterwards. Patient reports she believes this is round ligament pain, but has a history of PTL/PTD and wanted to be sure this was not that.  Pt denies VB, LOF, ctx, decreased FM, vaginal discharge/odor/itching. Pt denies N/V, constipation, diarrhea, or urinary problems. Pt denies fever, chills, fatigue, sweating or changes in appetite. Pt denies SOB or chest pain. Pt denies dizziness, HA, light-headedness, weakness.  Problems this pregnancy include: hx PTD. Allergies? NKDA Current medications/supplements? PNV, Makena   OB History    Gravida  3   Para  2   Term  1   Preterm  1   AB  0   Living  2     SAB  0   TAB  0   Ectopic  0   Multiple  0   Live Births  2           Past Medical History:  Diagnosis Date  . GERD (gastroesophageal reflux disease)   . Migraine   . Mild intermittent asthma   . Wears glasses     Past Surgical History:  Procedure Laterality Date  . HYSTEROSCOPY N/A 08/27/2016   Procedure: HYSTEROSCOPY excision of uterine septum;  Surgeon: Fermin Schwabamer Yalcinkaya, MD;  [facility] [place];  Service: Gynecology;  Laterality: N/A;  . TONSILLECTOMY  2007  . WISDOM TOOTH EXTRACTION      Family History  Problem Relation Age of Onset  . Cancer Maternal Grandfather   . Cancer Paternal Grandfather   . Hypertension Mother     Social History   Tobacco Use  . Smoking status: Never Smoker  . Smokeless tobacco: Never Used  Substance Use  Topics  . Alcohol use: No  . Drug use: No    Allergies: No Known Allergies  Medications Prior to Admission  Medication Sig Dispense Refill Last Dose  . cyclobenzaprine (FLEXERIL) 10 MG tablet Take 1 tablet (10 mg total) by mouth 3 (three) times daily as needed for muscle spasms. 30 tablet 0   . MAKENA 275 MG/1.1ML SOAJ Inject 1 Dose into the muscle once a week.      . Prenatal Vit-Fe Fumarate-FA (PRENATAL MULTIVITAMIN) TABS tablet Take 1 tablet by mouth daily at 12 noon.       Review of Systems  Constitutional: Negative for chills, diaphoresis, fatigue and fever.  Eyes: Negative for visual disturbance.  Respiratory: Negative for shortness of breath.   Cardiovascular: Negative for chest pain.  Gastrointestinal: Positive for abdominal pain. Negative for constipation, diarrhea, nausea and vomiting.  Genitourinary: Negative for dysuria, flank pain, frequency, pelvic pain, urgency, vaginal bleeding and vaginal discharge.  Neurological: Negative for dizziness, weakness, light-headedness and headaches.   Physical Exam   Blood pressure 111/69, pulse 94, resp. rate 16, SpO2 99 %, unknown if currently breastfeeding.  Patient Vitals for the past 24 hrs:  BP Pulse Resp SpO2  11/18/20 1625 -- -- -- 99 %  11/18/20 1621 111/69 94 16 --  Physical Exam Exam conducted with a chaperone present.  Constitutional:      General: She is not in acute distress.    Appearance: She is well-developed. She is not diaphoretic.  HENT:     Head: Normocephalic and atraumatic.  Pulmonary:     Effort: Pulmonary effort is normal.  Abdominal:     General: There is no distension.     Palpations: Abdomen is soft. There is no mass.     Tenderness: There is no abdominal tenderness. There is no guarding or rebound.  Genitourinary:    General: Normal vulva.     Labia:        Right: No rash, tenderness or lesion.        Left: No rash, tenderness or lesion.      Comments: Cervix closed but feels shortened on  exam. Sent to [place] for CL. Skin:    General: Skin is warm and dry.  Neurological:     Mental Status: She is alert and oriented to person, place, and time.  Psychiatric:        Behavior: Behavior normal.        Thought Content: Thought content normal.        Judgment: Judgment normal.    Results for orders placed or performed during the hospital encounter of 11/18/20 (from the past 24 hour(s))  Urinalysis, Routine w reflex microscopic Urine, Clean Catch     Status: Abnormal   Collection Time: 11/18/20  4:52 PM  Result Value Ref Range   Color, Urine YELLOW YELLOW   APPearance CLOUDY (A) CLEAR   Specific Gravity, Urine 1.009 1.005 - 1.030   pH 6.0 5.0 - 8.0   Glucose, UA NEGATIVE NEGATIVE mg/dL   Hgb urine dipstick NEGATIVE NEGATIVE   Bilirubin Urine NEGATIVE NEGATIVE   Ketones, ur NEGATIVE NEGATIVE mg/dL   Protein, ur NEGATIVE NEGATIVE mg/dL   Nitrite NEGATIVE NEGATIVE   Leukocytes,Ua MODERATE (A) NEGATIVE   RBC / HPF 0-5 0 - 5 RBC/hpf   WBC, UA 0-5 0 - 5 WBC/hpf   Bacteria, UA FEW (A) NONE SEEN   Squamous Epithelial / LPF 21-50 0 - 5   Mucus PRESENT   Wet prep, genital     Status: Abnormal   Collection Time: 11/18/20  5:27 PM   Specimen: Cervix  Result Value Ref Range   Yeast Wet Prep HPF POC NONE SEEN NONE SEEN   Trich, Wet Prep NONE SEEN NONE SEEN   Clue Cells Wet Prep HPF POC NONE SEEN NONE SEEN   WBC, Wet Prep HPF POC FEW (A) NONE SEEN   Sperm NONE SEEN    [place] MFM OB Transvaginal  Result Date: 11/18/2020 ----------------------------------------------------------------------  OBSTETRICS REPORT                       (Signed Final 11/18/2020 07:38 pm) ---------------------------------------------------------------------- Patient Info  ID #:       [id number]                          D.O.B.:  01-13-1986 (33 yrs)  Name:       Anna Brewer                  Visit Date: 11/18/2020 05:53 pm ---------------------------------------------------------------------- Performed By  Attending:         Noralee Space MD        Secondary Phy.:   [facility] MAU/Triage  Performed  By:     Birdena Crandall        [facility]  Referred By:      Marylen Ponto ---------------------------------------------------------------------- Orders  #  Description                           Code        Ordered By  1  [place] MFM OB TRANSVAGINAL                [phone]     Zymire Turnbo ----------------------------------------------------------------------  #  Order #                     Accession #                Episode #  1  [id number]                   [id number]                 [id number] ---------------------------------------------------------------------- Indications  Encounter for cervical length                  Z36.86  History of pre-term delivery                   O09.219  Abdominal pain in pregnancy                    O99.89  [redacted] weeks gestation of pregnancy                Z3A.21 ---------------------------------------------------------------------- Fetal Evaluation  Num Of Fetuses:         1  Fetal Heart Rate(bpm):  138  Cardiac Activity:       Observed  Presentation:           Breech  Placenta:               Posterior  Amniotic Fluid  AFI FV:      Subjectively upper-normal                              Largest Pocket(cm)                              8.1 ---------------------------------------------------------------------- OB History  Gravidity:    3         Term:   1        Prem:   1  Living:       2 ---------------------------------------------------------------------- Gestational Age  LMP:           21w 5d        Date:  06/19/20                 EDD:   03/26/21  Best:          [place] 5d     Det. By:  LMP  (06/19/20)          EDD:  03/26/21 ---------------------------------------------------------------------- Anatomy  Cranium:               Appears normal         Abdominal Wall:         Appears nml (cord                                                                         insert, abd wall)  Cavum:                 Appears normal         Cord Vessels:           Appears normal (3                                                                        vessel cord)  Stomach:               Appears normal, left   Kidneys:                Appear normal                         sided  Abdomen:               Appears normal         Bladder:                Appears normal ---------------------------------------------------------------------- Cervix Uterus Adnexa  Cervix  Length:            3.3  cm.  Normal appearance by transvaginal scan,  Uterus  Arcuate.  Cul De Sac  No free fluid seen. ---------------------------------------------------------------------- Impression  Patient is being evaluated at the MAU for c/o abdominal pain.  History of preterm deliveries.  Amniotic fluid is normal and good fetal activity is seen. On  transvaginal scan, the cervix measures 3.3 cm, which is  within normal limits.  Discussed with Joni Reining, MAU. ----------------------------------------------------------------------                  Noralee Space, MD Electronically Signed Final Report   11/18/2020 07:38 pm ----------------------------------------------------------------------   MAU Course  Procedures  MDM -suspect RLP, r/o PTL -UA: cloudy/mod leuks/few bacteria, sending urine for culture based on symptoms -CE: closed/shortened on exam -[place]: CL 3.3cm, normal per consultation with Dr. Judeth Cornfield -WetPrep: WNL -GC/CT collected -FHT 146 -pt discharged to home in stable condition  Orders Placed This Encounter  Procedures  . Wet prep, genital    Standing Status:   Standing    Number of Occurrences:   1  . Culture, OB Urine    Standing Status:   Standing    Number of Occurrences:   1  . [place] MFM OB Transvaginal    Standing Status:   Standing    Number of Occurrences:   1    Order Specific Question:   Symptom/Reason for Exam  Answer:   Encounter for antenatal  screening for cervical length [[id number]]  . Urinalysis, Routine w reflex microscopic Urine, Clean Catch    Standing Status:   Standing    Number of Occurrences:   1  . Ambulatory referral to Physical Therapy    Referral Priority:   Routine    Referral Type:   Physical Medicine    Referral Reason:   Specialty Services Required    Requested Specialty:   Physical Therapy    Number of Visits Requested:   1  . Discharge patient    Order Specific Question:   Discharge disposition    Answer:   01-Home or Self Care [1]    Order Specific Question:   Discharge patient date    Answer:   11/18/2020   Meds ordered this encounter  Medications  . Elastic Bandages & Supports (COMFORT FIT MATERNITY SUPP SM) MISC    Sig: 1 Units by Does not apply route daily as needed.    Dispense:  1 each    Refill:  0    Order Specific Question:   Supervising Provider    Answer:   [place] Bing [id number]  . cyclobenzaprine (FLEXERIL) 5 MG tablet    Sig: Take 1 tablet (5 mg total) by mouth 3 (three) times daily as needed for muscle spasms.    Dispense:  30 tablet    Refill:  0    Order Specific Question:   Supervising Provider    Answer:   Gleed Bing [[id number]]   Assessment and Plan   1. Round ligament pain   2. Encounter for antenatal screening for cervical length   3. [redacted] weeks gestation of pregnancy   4. History of preterm delivery     Allergies as of 11/18/2020   No Known Allergies     Medication List    TAKE these medications   Comfort Fit Maternity Supp Sm Misc 1 Units by Does not apply route daily as needed.   cyclobenzaprine 5 MG tablet Commonly known as: FLEXERIL Take 1 tablet (5 mg total) by mouth 3 (three) times daily as needed for muscle spasms. What changed:   medication strength  how much to take   Makena autoinjector Generic drug: HYDROXYprogesterone caproate Inject 1 Dose into the muscle once a week.   prenatal multivitamin Tabs tablet Take 1 tablet by mouth daily at  12 noon.      -will call with culture results, if positive -discussed expected s/sx of RLP -discussed s/sx of PTL -referral to pelvic PT for pelvic pain and hx of syphysis pubis pain/separation -RX pregnancy support belt -RX Flexeril 5mg  (pt reports she is sensitive to sedating medication, pt will start with 5mg  and can use 10mg  as allowed) -return MAU precautions given -pt discharged to home in stable condition  E Fernandez Kenley 11/18/2020, 7:52 PM

## 2020-11-18 NOTE — Discharge Instructions (Signed)
PREGNANCY SUPPORT BELT: You are not alone, Seventy-five percent of women have some sort of abdominal or back pain at some point in their pregnancy. Your baby is growing at a fast pace, which means that your whole body is rapidly trying to adjust to the changes. As your uterus grows, your back may start feeling a bit under stress and this can result in back or abdominal pain that can go from mild, and therefore bearable, to severe pains that will not allow you to sit or lay down comfortably, When it comes to dealing with pregnancy-related pains and cramps, some pregnant women usually prefer natural remedies, which the market is filled with nowadays. For example, wearing a pregnancy support belt can help ease and lessen your discomfort and pain. WHAT ARE THE BENEFITS OF WEARING A PREGNANCY SUPPORT BELT? A pregnancy support belt provides support to the lower portion of the belly taking some of the weight of the growing uterus and distributing to the other parts of your body. It is designed make you comfortable and gives you extra support. Over the years, the pregnancy apparel market has been studying the needs and wants of pregnant women and they have come up with the most comfortable pregnancy support belts that woman could ever ask for. In fact, you will no longer have to wear a stretched-out or bulky pregnancy belt that is visible underneath your clothes and makes you feel even more uncomfortable. Nowadays, a pregnancy support belt is made of comfortable and stretchy materials that will not irritate your skin but will actually make you feel at ease and you will not even notice you are wearing it. They are easy to put on and adjust during the day and can be worn at night for additional support.  BENEFITS: . Relives Back pain . Relieves Abdominal Muscle and Leg Pain . Stabilizes the Pelvic Ring . Offers a Cushioned Abdominal Lift Pad . Relieves pressure on the Sciatic Nerve Within Minutes WHERE TO GET  YOUR PREGNANCY BELT: [employer] [phone] @2301  [address] [place], [place]         Round Ligament Pain  The round ligament is a cord of muscle and tissue that helps support the uterus. It can become a source of pain during pregnancy if it becomes stretched or twisted as the baby grows. The pain usually begins in the second trimester (13-28 weeks) of pregnancy, and it can come and go until the baby is delivered. It is not a serious problem, and it does not cause harm to the baby. Round ligament pain is usually a short, sharp, and pinching pain, but it can also be a dull, lingering, and aching pain. The pain is felt in the lower side of the abdomen or in the groin. It usually starts deep in the groin and moves up to the outside of the hip area. The pain may occur when you:  Suddenly change position, such as quickly going from a sitting to standing position.  Roll over in bed.  Cough or sneeze.  Do physical activity. Follow these instructions at home:   Watch your condition for any changes.  When the pain starts, relax. Then try any of these methods to help with the pain: ? Sitting down. ? Flexing your knees up to your abdomen. ? Lying on your side with one pillow under your abdomen and another pillow between your legs. ? Sitting in a warm bath for 15-20 minutes or until the pain goes  away.  Take over-the-counter and prescription medicines only as told by your health care provider.  Move slowly when you sit down or stand up.  Avoid long walks if they cause pain.  Stop or reduce your physical activities if they cause pain.  Keep all follow-up visits as told by your health care provider. This is important. Contact a health care provider if:  Your pain does not go away with treatment.  You feel pain in your back that you did not have before.  Your medicine is not helping. Get help right away if:  You have a fever or chills.  You develop  uterine contractions.  You have vaginal bleeding.  You have nausea or vomiting.  You have diarrhea.  You have pain when you urinate. Summary  Round ligament pain is felt in the lower abdomen or groin. It is usually a short, sharp, and pinching pain. It can also be a dull, lingering, and aching pain.  This pain usually begins in the second trimester (13-28 weeks). It occurs because the uterus is stretching with the growing baby, and it is not harmful to the baby.  You may notice the pain when you suddenly change position, when you cough or sneeze, or during physical activity.  Relaxing, flexing your knees to your abdomen, lying on one side, or taking a warm bath may help to get rid of the pain.  Get help from your health care provider if the pain does not go away or if you have vaginal bleeding, nausea, vomiting, diarrhea, or painful urination. This information is not intended to replace advice given to you by your health care provider. Make sure you discuss any questions you have with your health care provider. Document Revised: 05/20/2018 Document Reviewed: 05/20/2018 Elsevier Patient Education  2020 [employer].        Preterm Labor and Birth Information  The normal length of a pregnancy is 39-41 weeks. Preterm labor is when labor starts before 37 completed weeks of pregnancy. What are the risk factors for preterm labor? Preterm labor is more likely to occur in women who:  Have certain infections during pregnancy such as a bladder infection, sexually transmitted infection, or infection inside the uterus (chorioamnionitis).  Have a shorter-than-normal cervix.  Have gone into preterm labor before.  Have had surgery on their cervix.  Are younger than age 10 or older than age 71.  Are African American.  Are pregnant with twins or multiple babies (multiple gestation).  Take street drugs or smoke while pregnant.  Do not gain enough weight while pregnant.  Became  pregnant shortly after having been pregnant. What are the symptoms of preterm labor? Symptoms of preterm labor include:  Cramps similar to those that can happen during a menstrual period. The cramps may happen with diarrhea.  Pain in the abdomen or lower back.  Regular uterine contractions that may feel like tightening of the abdomen.  A feeling of increased pressure in the pelvis.  Increased watery or bloody mucus discharge from the vagina.  Water breaking (ruptured amniotic sac). Why is it important to recognize signs of preterm labor? It is important to recognize signs of preterm labor because babies who are born prematurely may not be fully developed. This can put them at an increased risk for:  Long-term (chronic) heart and lung problems.  Difficulty immediately after birth with regulating body systems, including blood sugar, body temperature, heart rate, and breathing rate.  Bleeding in the brain.  Cerebral palsy.  Learning  difficulties.  Death. These risks are highest for babies who are born before 34 weeks of pregnancy. How is preterm labor treated? Treatment depends on the length of your pregnancy, your condition, and the health of your baby. It may involve:  Having a stitch (suture) placed in your cervix to prevent your cervix from opening too early (cerclage).  Taking or being given medicines, such as: ? Hormone medicines. These may be given early in pregnancy to help support the pregnancy. ? Medicine to stop contractions. ? Medicines to help mature the baby's lungs. These may be prescribed if the risk of delivery is high. ? Medicines to prevent your baby from developing cerebral palsy. If the labor happens before 34 weeks of pregnancy, you may need to stay in the hospital. What should I do if I think I am in preterm labor? If you think that you are going into preterm labor, call your health care provider right away. How can I prevent preterm labor in future  pregnancies? To increase your chance of having a full-term pregnancy:  Do not use any tobacco products, such as cigarettes, chewing tobacco, and e-cigarettes. If you need help quitting, ask your health care provider.  Do not use street drugs or medicines that have not been prescribed to you during your pregnancy.  Talk with your health care provider before taking any herbal supplements, even if you have been taking them regularly.  Make sure you gain a healthy amount of weight during your pregnancy.  Watch for infection. If you think that you might have an infection, get it checked right away.  Make sure to tell your health care provider if you have gone into preterm labor before. This information is not intended to replace advice given to you by your health care provider. Make sure you discuss any questions you have with your health care provider. Document Revised: 03/26/2019 Document Reviewed: 04/24/2016 Elsevier Patient Education  2020 [employer].

## 2020-11-19 LAB — CULTURE, OB URINE

## 2020-11-20 LAB — GC/CHLAMYDIA PROBE AMP (~~LOC~~) NOT AT ARMC
Chlamydia: NEGATIVE
Comment: NEGATIVE
Comment: NORMAL
Neisseria Gonorrhea: NEGATIVE

## 2020-12-05 ENCOUNTER — Other Ambulatory Visit: Payer: Self-pay

## 2020-12-05 ENCOUNTER — Encounter: Payer: Managed Care, Other (non HMO) | Attending: Women's Health | Admitting: Physical Therapy

## 2020-12-05 ENCOUNTER — Encounter: Payer: Self-pay | Admitting: Physical Therapy

## 2020-12-05 DIAGNOSIS — O99891 Other specified diseases and conditions complicating pregnancy: Secondary | ICD-10-CM | POA: Insufficient documentation

## 2020-12-05 DIAGNOSIS — M7918 Myalgia, other site: Secondary | ICD-10-CM | POA: Insufficient documentation

## 2020-12-05 DIAGNOSIS — M6281 Muscle weakness (generalized): Secondary | ICD-10-CM | POA: Insufficient documentation

## 2020-12-05 DIAGNOSIS — R278 Other lack of coordination: Secondary | ICD-10-CM | POA: Diagnosis present

## 2020-12-05 NOTE — Therapy (Addendum)
[place] at [facility] [address] [place], [place], [address] Phone: [phone]   Fax:  [phone]  Physical Therapy Evaluation  Patient Details  Name: Anna Brewer MRN: [id number] Date of Birth: 10-06-1986 Referring Provider (PT): Dr. Vernice Jefferson   Encounter Date: 12/05/2020   PT End of Session - 12/05/20 1519    Visit Number 1    Date for PT Re-Evaluation 02/27/21    Authorization Type [employer]    PT Start Time 1300    PT Stop Time 8850    PT Time Calculation (min) 55 min    Activity Tolerance Patient tolerated treatment well;No increased pain    Behavior During Therapy WFL for tasks assessed/performed           Past Medical History:  Diagnosis Date  . GERD (gastroesophageal reflux disease)   . Migraine   . Mild intermittent asthma   . Wears glasses     Past Surgical History:  Procedure Laterality Date  . HYSTEROSCOPY N/A 08/27/2016   Procedure: HYSTEROSCOPY excision of uterine septum;  Surgeon: Governor Specking, MD;  Location: [place];  Service: Gynecology;  Laterality: N/A;  . TONSILLECTOMY  2007  . WISDOM TOOTH EXTRACTION      There were no vitals filed for this visit.    Subjective Assessment - 12/05/20 1308    Subjective The Progesterone shots have increase ligament pain. Laying and moving to positions has alot op pain.    Patient Stated Goals Keep mobility during pregnancy and understand ways to manage pain    Currently in Pain? Yes    Pain Score 8     Pain Location Pelvis   pubic bone   Pain Orientation Anterior;Posterior    Pain Descriptors / Indicators Pressure;Discomfort;Aching    Pain Type Acute pain    Pain Onset More than a month ago    Pain Frequency Intermittent    Aggravating Factors  laying down and get up, walking,    Pain Relieving Factors depends on position              [facility] PT Assessment - 12/05/20 0001      Assessment   Medical Diagnosis N94.9  Round ligament pain    Referring Provider (PT) Dr. Vernice Jefferson    Onset Date/Surgical Date 10/16/20    Prior Therapy none      Precautions   Precautions Other (comment)    Precaution Comments pregnant      Restrictions   Weight Bearing Restrictions No      Balance Screen   Has the patient fallen in the past 6 months No    Has the patient had a decrease in activity level because of a fear of falling?  No    Is the patient reluctant to leave their home because of a fear of falling?  No      Home Ecologist residence      Prior Function   Level of Independence Independent    Vocation Full time employment    Vocation Requirements sitting, event increased activity    Leisure walking with the kids      Cognition   Overall Cognitive Status Within Functional Limits for tasks assessed      Posture/Postural Control   Posture/Postural Control Postural limitations    Posture Comments pregnant posture      ROM / Strength   AROM / PROM / Strength AROM;PROM;Strength  AROM   Overall AROM Comments full lumbar ROM      Strength   Right Hip Flexion 4/5   brings knee inward   Right Hip Extension 4-/5    Right Hip ABduction 3/5    Right Hip ADduction 3/5    Left Hip Extension 4-/5    Left Hip ABduction 3/5    Left Hip ADduction 3/5      Palpation   SI assessment  left ilium is posteriorly    Palpation comment tenderness located superior to pubic bone, right inner groin more tender than left with tightness,      Transfers   Comments needs help from her husband to move in be and to get out of bed                      Objective measurements completed on examination: See above findings.     Pelvic Floor Special Questions - 12/05/20 0001    Prior Pregnancies Yes    Number of Pregnancies 2   presently pregnant; 03/26/2021   Number of Vaginal Deliveries 2    Any difficulty with labor and deliveries Yes   born 21 weeks early, 2nd child  full term   Episiotomy Performed No   3rd degree with 2nd child and first with first child   Currently Sexually Active No    Urinary Leakage No    Urinary urgency No    Fecal incontinence Yes   not straining, BM 2-3 days           [facility] Adult PT Treatment/Exercise - 12/05/20 0001      Therapeutic Activites    Therapeutic Activities ADL's    ADL's education on using a pillow between her knees, breathing out and roll like a log with bed mobility and getting out of bed      Manual Therapy   Manual Therapy Muscle Energy Technique;Soft tissue mobilization    Soft tissue mobilization right hip adductor, right suprapubic area, and right lower quadrant    Muscle Energy Technique to correct the sacrum and ilium                  PT Education - 12/05/20 1519    Education Details educated patient on garment to help with pubic bone pain    Person(s) Educated Patient    Methods Explanation    Comprehension Verbalized understanding            PT Short Term Goals - 12/05/20 1546      PT SHORT TERM GOAL #1   Title understand ways to using log rolling to get out of bed to redue strain o nthe pubic symphysis    Time 4    Period Weeks    Status New    Target Date 01/02/21      PT SHORT TERM GOAL #2   Title understand correct body mechanics with daily activities to reduce strain on the symphysis pubis    Time 4    Period Weeks    Status New    Target Date 01/02/21             PT Long Term Goals - 12/05/20 1547      PT LONG TERM GOAL #1   Title independent with HEP to include gluteal, hip adductor and hamstring activation to stabilize her pelvis    Time 12    Period Weeks    Status New    Target Date  02/27/21      PT LONG TERM GOAL #2   Title understand ways to manage her pain in the pelvis and hips as her pregnancy progresses so she is able to take care of her 2 young children    Time 12    Period Weeks    Status New    Target Date 02/27/21      PT LONG TERM  GOAL #3   Title able to demonstrat perineal massage to reduce tearing with her third child    Time 12    Period Weeks    Status New    Target Date 02/27/21      PT LONG TERM GOAL #4   Title understand different ways to give birth to reduce the strain on the symphysis pubis    Time 12    Period Weeks    Status New    Target Date 02/27/21                  Plan - 12/05/20 1520    Clinical Impression Statement Patient is a 34 year old female with round ligament and pubic symphysis pain since she started with her progesterone shots. Patient reports her intermitten pain level is 8/10 with movement and being still in certain positions. Her left ilium is rotated posteriorly. Sacrum is rotated. She has muscle tightness in the lumbar region. Patient has palpable tenderness located in the pubic bone, right hip adductor, right quads, right iliotibial band, and right gluteal. Patient has weakness in bilateal hip. Patient reported in the past pregnancy when she had to have progesterone shots she had increased pain and ligamentous laxity. When the end of her last pregnancy came she was having difficult with walking. Patient will benefit from skilled therapy to assist in pain management, maintain function during pregnancy to work and take care of her 2 little children.    Personal Factors and Comorbidities Comorbidity 1;Past/Current Experience    Comorbidities presently pregnant and due 03/26/2021; taking progesterone shots that increase her ligamentous laxity    Examination-Activity Limitations Bathing;Sit;Bed Mobility;Bend;Caring for Others;Stairs;Carry;Stand;Dressing;Transfers;Locomotion Level;Reach Overhead    Examination-Participation Restrictions Meal Prep;Occupation;Laundry;Driving;Community Activity;Shop;Cleaning    Stability/Clinical Decision Making Evolving/Moderate complexity    Clinical Decision Making Low    Rehab Potential Good    PT Frequency 1x / week    PT Duration 12 weeks    PT  Treatment/Interventions ADLs/Self Care Home Management;Cryotherapy;Moist Heat;Functional mobility training;Therapeutic activities;Therapeutic exercise;Neuromuscular re-education;Patient/family education;Manual techniques;Energy conservation    PT Next Visit Plan see if she has gotten the garment; assess pelvic alignment, sitting H-S curl; sitting iliacus pull back and in quadruped; quadruped with band around knees and shift with using gluteals to push knee into band    Consulted and Agree with Plan of Care Patient           Patient will benefit from skilled therapeutic intervention in order to improve the following deficits and impairments:  Decreased endurance,Pain,Decreased activity tolerance,Decreased mobility,Increased muscle spasms,Difficulty walking,Increased fascial restricitons,Decreased strength  Visit Diagnosis: Muscle weakness (generalized) - Plan: PT plan of care cert/re-cert  Other lack of coordination - Plan: PT plan of care cert/re-cert  Pain in symphysis pubis during pregnancy - Plan: PT plan of care cert/re-cert     Problem List Patient Active Problem List   Diagnosis Date Noted  . Preterm labor 04/26/2018  . Pregnancy 06/26/2015  . NSVD (normal spontaneous vaginal delivery) 06/26/2015  . Premature rupture of membranes   . [redacted] weeks gestation of pregnancy   .  Preterm premature rupture of membranes (PPROM) with onset of labor after 24 hours of rupture in second trimester, antepartum   . [redacted] weeks gestation of pregnancy   . Encounter for fetal anatomic survey   . Amniotic fluid leaking   . Bicornuate uterus   . Preterm premature rupture of membranes (PPROM) with unknown onset of labor 06/01/2015  . HAY FEVER 09/09/2011  . ASTHMA, MILD 09/09/2011    Earlie Counts, PT 12/05/20 3:53 PM   [facility] at [facility] [address], [place], [place], [address] Phone: [phone]   Fax:  [phone]  Name: Anna Brewer MRN: [id number] Date of Birth: [birth year] PHYSICAL THERAPY DISCHARGE SUMMARY  Visits from Start of Care: 1  Current functional level related to goals / functional outcomes: Patient is being discharged due to our cancellation policy. She has no-showed for her last 2 physical therapy visits.    Remaining deficits: Unable to assess patient due to her not attending her last 2 sessions.    Education / Equipment: HEP Plan:                                                    Patient goals were not met. Patient is being discharged due to not returning since the last visit. Thank you for the referral. Earlie Counts, PT 01/09/21 5:17 PM   ?????

## 2020-12-16 NOTE — L&D Delivery Note (Signed)
Delivery Note At 3:22 PM a viable female was delivered via Vaginal, Spontaneous (Presentation: Right Occiput Anterior). Nuchal cord x 1 noted and reduced mid delivery. APGAR: 9, 9; weight  .   Placenta status: Spontaneous, Intact.  Cord: 3 vessels.  Uterus manually explored, cavity WNL, cleared of all clot and debris. Complications: None.  Cord pH: pend  Anesthesia: Epidural Episiotomy: None Lacerations: 2nd degree through obstetric scar.  RV exam performed. Suture Repair: 2.0 Est. Blood Loss (mL): 300  Mom to postpartum.  Baby to Couplet care / Skin to Skin.  Will monitor bleeding for PPH risk: large baby, rapid progression, and history of increased bleeding following G2 delivery.  Lyn Henri 03/19/2021, 3:52 PM

## 2020-12-17 ENCOUNTER — Encounter (HOSPITAL_COMMUNITY): Payer: Self-pay | Admitting: Obstetrics and Gynecology

## 2020-12-17 ENCOUNTER — Other Ambulatory Visit: Payer: Self-pay

## 2020-12-17 ENCOUNTER — Inpatient Hospital Stay (HOSPITAL_COMMUNITY)
Admission: AD | Admit: 2020-12-17 | Discharge: 2020-12-17 | Disposition: A | Discharge status: Home / Self Care | Payer: 59 | Attending: Obstetrics and Gynecology | Admitting: Obstetrics and Gynecology

## 2020-12-17 DIAGNOSIS — O99612 Diseases of the digestive system complicating pregnancy, second trimester: Secondary | ICD-10-CM | POA: Insufficient documentation

## 2020-12-17 DIAGNOSIS — M549 Dorsalgia, unspecified: Secondary | ICD-10-CM | POA: Diagnosis not present

## 2020-12-17 DIAGNOSIS — O4700 False labor before 37 completed weeks of gestation, unspecified trimester: Secondary | ICD-10-CM

## 2020-12-17 DIAGNOSIS — K5900 Constipation, unspecified: Secondary | ICD-10-CM | POA: Insufficient documentation

## 2020-12-17 DIAGNOSIS — O99891 Other specified diseases and conditions complicating pregnancy: Secondary | ICD-10-CM

## 2020-12-17 DIAGNOSIS — O26892 Other specified pregnancy related conditions, second trimester: Secondary | ICD-10-CM | POA: Diagnosis not present

## 2020-12-17 DIAGNOSIS — O4702 False labor before 37 completed weeks of gestation, second trimester: Secondary | ICD-10-CM

## 2020-12-17 DIAGNOSIS — Z3A25 25 weeks gestation of pregnancy: Secondary | ICD-10-CM | POA: Diagnosis not present

## 2020-12-17 LAB — URINALYSIS, ROUTINE W REFLEX MICROSCOPIC
Bilirubin Urine: NEGATIVE
Glucose, UA: NEGATIVE mg/dL
Hgb urine dipstick: NEGATIVE
Ketones, ur: NEGATIVE mg/dL
Nitrite: NEGATIVE
Protein, ur: NEGATIVE mg/dL
Specific Gravity, Urine: 1.009 (ref 1.005–1.030)
pH: 7 (ref 5.0–8.0)

## 2020-12-17 MED ORDER — CYCLOBENZAPRINE HCL 5 MG PO TABS
10.0000 mg | ORAL_TABLET | Freq: Once | ORAL | Status: AC
  Administered 2020-12-17: 10 mg via ORAL
  Filled 2020-12-17: qty 2

## 2020-12-17 MED ORDER — NIFEDIPINE 10 MG PO CAPS
10.0000 mg | ORAL_CAPSULE | ORAL | Status: AC
  Administered 2020-12-17: 10 mg via ORAL
  Filled 2020-12-17 (×2): qty 1

## 2020-12-17 MED ORDER — LACTATED RINGERS IV BOLUS
1000.0000 mL | Freq: Once | INTRAVENOUS | Status: AC
  Administered 2020-12-17: 1000 mL via INTRAVENOUS

## 2020-12-17 NOTE — MAU Provider Note (Signed)
History     CSN: [id number]  Arrival date and time: 12/17/20 1201   Event Date/Time   First Provider Initiated Contact with Patient 12/17/20 1326      Chief Complaint  Patient presents with  . Back Pain  . Constipation   Anna Brewer is a 35 y.o. year old G78P1102 female at [redacted]w[redacted]d weeks gestation who presents to MAU reporting "really bad" back pain last night; "not that bad today." She rates the pain 5/10. She states she has "felt really off" for the past 2 days and constipated (only going 1 or twice a week; but not new even before pregnancy) and feeling very uncomfortable. She denies VB, but reports "some discharge and not sure if she is ruptured." She reports intermittent UCs for "several days." She took Tylenol 500 mg at 0200 this morning with no relief of back pain. She has a h/o PPROM at 28 wks with SVD @ 32 wks and then PTL with term SVD. She receives weekly Makena injections at [facility]; next appt is 12/20/2020.   OB History    Gravida  3   Para  2   Term  1   Preterm  1   AB  0   Living  2     SAB  0   IAB  0   Ectopic  0   Multiple  0   Live Births  2           Past Medical History:  Diagnosis Date  . GERD (gastroesophageal reflux disease)   . Migraine   . Mild intermittent asthma   . Wears glasses     Past Surgical History:  Procedure Laterality Date  . HYSTEROSCOPY N/A 08/27/2016   Procedure: HYSTEROSCOPY excision of uterine septum;  Surgeon: Fermin Schwab, MD;  Location: [facility];  Service: Gynecology;  Laterality: N/A;  . TONSILLECTOMY  2007  . WISDOM TOOTH EXTRACTION      Family History  Problem Relation Age of Onset  . Cancer Maternal Grandfather   . Cancer Paternal Grandfather   . Hypertension Mother     Social History   Tobacco Use  . Smoking status: Never Smoker  . Smokeless tobacco: Never Used  Substance Use Topics  . Alcohol use: No  . Drug use: No    Allergies: No Known  Allergies  Medications Prior to Admission  Medication Sig Dispense Refill Last Dose  . Prenatal Vit-Fe Fumarate-FA (PRENATAL MULTIVITAMIN) TABS tablet Take 1 tablet by mouth daily at 12 noon.   12/17/2020 at Unknown time  . cyclobenzaprine (FLEXERIL) 5 MG tablet Take 1 tablet (5 mg total) by mouth 3 (three) times daily as needed for muscle spasms. (Patient not taking: Reported on 12/05/2020) 30 tablet 0   . Elastic Bandages & Supports (COMFORT FIT MATERNITY SUPP SM) MISC 1 Units by Does not apply route daily as needed. (Patient not taking: Reported on 12/05/2020) 1 each 0   . MAKENA 275 MG/1.1ML SOAJ Inject 1 Dose into the muscle once a week.        Review of Systems  Constitutional: Negative.   HENT: Negative.   Eyes: Negative.   Respiratory: Negative.   Cardiovascular: Negative.   Gastrointestinal: Negative.   Endocrine: Negative.   Genitourinary: Positive for pelvic pain (intermittent contractions) and vaginal discharge.  Musculoskeletal: Positive for back pain.  Skin: Negative.   Allergic/Immunologic: Negative.   Neurological: Negative.   Hematological: Negative.   Psychiatric/Behavioral: Negative.  Physical Exam   Blood pressure (!) 99/59, pulse 100, temperature 98.6 F (37 C), temperature source Oral, resp. rate 15, height 5\' 7"  (1.702 m), weight 77.6 kg, SpO2 98 %, unknown if currently breastfeeding.  Physical Exam Constitutional:      Appearance: Normal appearance. She is normal weight.  HENT:     Head: Normocephalic and atraumatic.  Pulmonary:     Effort: Pulmonary effort is normal.  Abdominal:     Palpations: Abdomen is soft.  Genitourinary:    General: Normal vulva.     Vagina: No vaginal discharge.     Comments: Dilation: Closed Exam by: , CNM  Musculoskeletal:        General: Normal range of motion.     Cervical back: Normal range of motion.  Skin:    General: Skin is warm and dry.  Neurological:     Mental Status: She is alert and  oriented to person, place, and time.  Psychiatric:        Mood and Affect: Mood normal.        Behavior: Behavior normal.        Thought Content: Thought content normal.        Judgment: Judgment normal.     MAU Course  Procedures  MDM CCUA [place] -- Negative per RN report fFN collected, but not sent after cervical exam LR 1000 ml bolus Procardia 10 mg every 20 mins x 3 doses Flexeril 10 mg -- improved back pain, but not resolved  Results for orders placed or performed during the hospital encounter of 12/17/20 (from the past 24 hour(s))  Urinalysis, Routine w reflex microscopic Urine, Clean Catch     Status: Abnormal   Collection Time: 12/17/20 12:47 PM  Result Value Ref Range   Color, Urine YELLOW YELLOW   APPearance HAZY (A) CLEAR   Specific Gravity, Urine 1.009 1.005 - 1.030   pH 7.0 5.0 - 8.0   Glucose, UA NEGATIVE NEGATIVE mg/dL   Hgb urine dipstick NEGATIVE NEGATIVE   Bilirubin Urine NEGATIVE NEGATIVE   Ketones, ur NEGATIVE NEGATIVE mg/dL   Protein, ur NEGATIVE NEGATIVE mg/dL   Nitrite NEGATIVE NEGATIVE   Leukocytes,Ua MODERATE (A) NEGATIVE   RBC / HPF 0-5 0 - 5 RBC/hpf   WBC, UA 0-5 0 - 5 WBC/hpf   Bacteria, UA RARE (A) NONE SEEN   Squamous Epithelial / LPF 11-20 0 - 5    Assessment and Plan  Back pain affecting pregnancy in second trimester - Plan: Discharge patient - Advised to take Flexeril that was previously prescribed to her according to medication hx in Epic - Information provided on back pain in pregnancy   Preterm contractions  - Information provided on preventing PTB   - Discharge patient - Keep scheduled appt with [facility] on 12/20/2020 - Patient verbalized an understanding of the plan of care and agrees.     02/17/2021, CNM 12/17/2020, 4:09 PM

## 2020-12-17 NOTE — Discharge Instructions (Signed)
Please take the Flexeril that you have been prescribed previously for your back pain and also discuss it with your OB doctor.

## 2020-12-17 NOTE — MAU Note (Signed)
Anna Brewer is a 35 y.o. at [redacted]w[redacted]d here in MAU reporting: really bad back pain last night. Pain is not as bad today. Also reports for the past 2 days she has felt really off, states she has been constipated. No bleeding or LOF. +FM  Onset of complaint: yesterday  Pain score: 5/10  Vitals:   12/17/20 1227  BP: 114/73  Pulse: (!) 112  Resp: 18  Temp: 98.6 F (37 C)  SpO2: 98%     FHT: +FM  Lab orders placed from triage: UA

## 2020-12-19 ENCOUNTER — Encounter: Payer: Managed Care, Other (non HMO) | Admitting: Physical Therapy

## 2020-12-20 LAB — OB RESULTS CONSOLE HIV ANTIBODY (ROUTINE TESTING): HIV: NONREACTIVE

## 2021-01-02 ENCOUNTER — Encounter: Payer: Self-pay | Admitting: Physical Therapy

## 2021-01-09 ENCOUNTER — Telehealth: Payer: Self-pay | Admitting: Physical Therapy

## 2021-01-09 ENCOUNTER — Encounter: Payer: Managed Care, Other (non HMO) | Admitting: Physical Therapy

## 2021-01-09 NOTE — Telephone Encounter (Signed)
Called patient about her missed appointment today at 10:30. Left a message.  Eulis Foster, PT @1 /25/2021@ 10:50 AM

## 2021-01-16 ENCOUNTER — Encounter: Payer: Managed Care, Other (non HMO) | Admitting: Physical Therapy

## 2021-01-23 ENCOUNTER — Encounter: Payer: Self-pay | Admitting: Physical Therapy

## 2021-01-28 ENCOUNTER — Encounter (HOSPITAL_COMMUNITY): Payer: Self-pay | Admitting: Obstetrics and Gynecology

## 2021-01-28 ENCOUNTER — Other Ambulatory Visit: Payer: Self-pay

## 2021-01-28 ENCOUNTER — Inpatient Hospital Stay (HOSPITAL_COMMUNITY)
Admission: AD | Admit: 2021-01-28 | Discharge: 2021-01-29 | Disposition: A | Discharge status: Home / Self Care | Payer: 59 | Attending: Obstetrics and Gynecology | Admitting: Obstetrics and Gynecology

## 2021-01-28 DIAGNOSIS — B373 Candidiasis of vulva and vagina: Secondary | ICD-10-CM | POA: Insufficient documentation

## 2021-01-28 DIAGNOSIS — O23593 Infection of other part of genital tract in pregnancy, third trimester: Secondary | ICD-10-CM | POA: Insufficient documentation

## 2021-01-28 DIAGNOSIS — O4703 False labor before 37 completed weeks of gestation, third trimester: Secondary | ICD-10-CM | POA: Diagnosis not present

## 2021-01-28 DIAGNOSIS — B3731 Acute candidiasis of vulva and vagina: Secondary | ICD-10-CM

## 2021-01-28 DIAGNOSIS — Z3A32 32 weeks gestation of pregnancy: Secondary | ICD-10-CM | POA: Diagnosis not present

## 2021-01-28 DIAGNOSIS — O98813 Other maternal infectious and parasitic diseases complicating pregnancy, third trimester: Secondary | ICD-10-CM | POA: Diagnosis not present

## 2021-01-28 DIAGNOSIS — O4700 False labor before 37 completed weeks of gestation, unspecified trimester: Secondary | ICD-10-CM

## 2021-01-28 DIAGNOSIS — O26893 Other specified pregnancy related conditions, third trimester: Secondary | ICD-10-CM | POA: Diagnosis present

## 2021-01-28 MED ORDER — NIFEDIPINE 10 MG PO CAPS
10.0000 mg | ORAL_CAPSULE | ORAL | Status: AC
  Administered 2021-01-29 (×3): 10 mg via ORAL
  Filled 2021-01-28 (×4): qty 1

## 2021-01-28 NOTE — MAU Provider Note (Addendum)
History     CSN: [id number]  Arrival date and time: 01/28/21 2201   Event Date/Time   First Provider Initiated Contact with Patient 01/28/21 2334      Chief Complaint  Patient presents with  . Contractions   Anna Brewer is a 35 y.o. year old G4P1102 female at [redacted]w[redacted]d weeks gestation who presents to MAU reporting sporadic contractions all week that have picked up over the weekend. She was seen on Friday for OB and 17-P injection. She tried increasing water intake and resting, but that did not relieve contractions. She states the contractions got worse with lying down. She did not have a vaginal exam in the office. She has a h/o PPROM at 28 weeks with a SVD at 32 weeks; then PTL at 32 wks with term SVD. Her spouse is present contributing to history taking.   OB History    Gravida  3   Para  2   Term  1   Preterm  1   AB  0   Living  2     SAB  0   IAB  0   Ectopic  0   Multiple  0   Live Births  2           Past Medical History:  Diagnosis Date  . GERD (gastroesophageal reflux disease)   . Migraine   . Mild intermittent asthma   . Wears glasses     Past Surgical History:  Procedure Laterality Date  . HYSTEROSCOPY N/A 08/27/2016   Procedure: HYSTEROSCOPY excision of uterine septum;  Surgeon: Fermin Schwab, MD;  Location: [facility];  Service: Gynecology;  Laterality: N/A;  . TONSILLECTOMY  2007  . WISDOM TOOTH EXTRACTION      Family History  Problem Relation Age of Onset  . Cancer Maternal Grandfather   . Cancer Paternal Grandfather   . Hypertension Mother     Social History   Tobacco Use  . Smoking status: Never Smoker  . Smokeless tobacco: Never Used  Substance Use Topics  . Alcohol use: No  . Drug use: No    Allergies: No Known Allergies  Medications Prior to Admission  Medication Sig Dispense Refill Last Dose  . docusate sodium (COLACE) 100 MG capsule Take 100 mg by mouth daily.   01/27/2021 at Unknown time  .  MAGNESIUM GLYCINATE PO Take 120 mg by mouth daily.   01/27/2021 at Unknown time  . Prenatal Vit-Fe Fumarate-FA (PRENATAL MULTIVITAMIN) TABS tablet Take 1 tablet by mouth daily at 12 noon.   01/27/2021 at Unknown time  . Probiotic Product (PROBIOTIC DAILY PO) Take 1 tablet by mouth daily.   01/27/2021 at Unknown time  . VITAMIN D PO Take 5,000 Units by mouth at bedtime.   01/27/2021 at Unknown time  . cyclobenzaprine (FLEXERIL) 5 MG tablet Take 1 tablet (5 mg total) by mouth 3 (three) times daily as needed for muscle spasms. (Patient not taking: Reported on 12/05/2020) 30 tablet 0   . Elastic Bandages & Supports (COMFORT FIT MATERNITY SUPP SM) MISC 1 Units by Does not apply route daily as needed. (Patient not taking: Reported on 12/05/2020) 1 each 0   . MAKENA 275 MG/1.1ML SOAJ Inject 1 Dose into the muscle once a week.        Review of Systems Physical Exam   Blood pressure (!) 104/57, pulse (!) 103, SpO2 98 %, unknown if currently breastfeeding.  Physical Exam Cardiovascular:     Pulses:  Normal pulses.  Pulmonary:     Effort: Pulmonary effort is normal.  Abdominal:     Palpations: Abdomen is soft.  Genitourinary:    General: Normal vulva.     Comments: Uterus: gravid, S=D, SE: cervix is smooth, pink, no lesions, copious amt of thick, white vaginal d/c with some adherence to vaginal walls, suspicious for yeast infection -- fFN and WP done, cervix: closed/50%/soft, no CMT or friability, no adnexal tenderness  Skin:    General: Skin is warm and dry.  Neurological:     Mental Status: She is oriented to person, place, and time.  Psychiatric:        Mood and Affect: Mood normal.        Behavior: Behavior normal.        Thought Content: Thought content normal.        Judgment: Judgment normal.   Reassessment at 0255: no cervical change  REACTIVE NST - FHR: 125 bpm / moderate variability / accels present / decels absent / TOCO: regular every 1-4 mins; UI after Procardia x 2 doses MAU Course   Procedures  MDM Wet Prep fFN LR 1000 ml @ 500 ml/hr Procardia 10 mg every 20 mins x 3 doses -- 3rd dose held due to fewer contractions and low BP (104/57)  Results for orders placed or performed during the hospital encounter of 01/28/21 (from the past 24 hour(s))  Wet prep, genital     Status: Abnormal   Collection Time: 01/28/21 11:57 PM  Result Value Ref Range   Yeast Wet Prep HPF POC PRESENT (A) NONE SEEN   Trich, Wet Prep NONE SEEN NONE SEEN   Clue Cells Wet Prep HPF POC NONE SEEN NONE SEEN   WBC, Wet Prep HPF POC MANY (A) NONE SEEN   Sperm NONE SEEN   Fetal fibronectin     Status: None   Collection Time: 01/28/21 11:57 PM  Result Value Ref Range   Fetal Fibronectin NEGATIVE NEGATIVE    Assessment and Plan  Preterm contractions - Information provided on preterm labor - Advised no need for BMZ tonight d/t negative fFN - Explained there is no cervical change with the preterm contractions she is having. - Follow-up with [facility] tomorrow   Candida vaginitis - Rx for Diflucan 150 mg po every 3 days x 3 doses - Information provided on vaginal yeast  - Discharge patient - Advised to call [facility] tomorrow - Keep appt with [facility] on Friday 02/02/2021 - Patient verbalized an understanding of the plan of care and agrees.    Raelyn Mora, CNM 01/28/2021, 11:57 PM

## 2021-01-28 NOTE — MAU Provider Note (Incomplete)
  History     CSN: [id number]  Arrival date and time: 01/28/21 2201   Event Date/Time   First Provider Initiated Contact with Patient 01/28/21 2334      Chief Complaint  Patient presents with  . Contractions   HPI  {GYN/OB [id number]}  Past Medical History:  Diagnosis Date  . GERD (gastroesophageal reflux disease)   . Migraine   . Mild intermittent asthma   . Wears glasses     Past Surgical History:  Procedure Laterality Date  . HYSTEROSCOPY N/A 08/27/2016   Procedure: HYSTEROSCOPY excision of uterine septum;  Surgeon: Fermin Schwab, MD;  Location: [facility];  Service: Gynecology;  Laterality: N/A;  . TONSILLECTOMY  2007  . WISDOM TOOTH EXTRACTION      Family History  Problem Relation Age of Onset  . Cancer Maternal Grandfather   . Cancer Paternal Grandfather   . Hypertension Mother     Social History   Tobacco Use  . Smoking status: Never Smoker  . Smokeless tobacco: Never Used  Substance Use Topics  . Alcohol use: No  . Drug use: No    Allergies: No Known Allergies  Medications Prior to Admission  Medication Sig Dispense Refill Last Dose  . docusate sodium (COLACE) 100 MG capsule Take 100 mg by mouth daily.   01/27/2021 at Unknown time  . MAGNESIUM GLYCINATE PO Take 120 mg by mouth daily.   01/27/2021 at Unknown time  . Prenatal Vit-Fe Fumarate-FA (PRENATAL MULTIVITAMIN) TABS tablet Take 1 tablet by mouth daily at 12 noon.   01/27/2021 at Unknown time  . Probiotic Product (PROBIOTIC DAILY PO) Take 1 tablet by mouth daily.   01/27/2021 at Unknown time  . VITAMIN D PO Take 5,000 Units by mouth at bedtime.   01/27/2021 at Unknown time  . cyclobenzaprine (FLEXERIL) 5 MG tablet Take 1 tablet (5 mg total) by mouth 3 (three) times daily as needed for muscle spasms. (Patient not taking: Reported on 12/05/2020) 30 tablet 0   . Elastic Bandages & Supports (COMFORT FIT MATERNITY SUPP SM) MISC 1 Units by Does not apply route daily as needed. (Patient  not taking: Reported on 12/05/2020) 1 each 0   . MAKENA 275 MG/1.1ML SOAJ Inject 1 Dose into the muscle once a week.        Review of Systems Physical Exam   Blood pressure 111/69, pulse 96, unknown if currently breastfeeding.  Physical Exam Cardiovascular:     Pulses: Normal pulses.  Pulmonary:     Effort: Pulmonary effort is normal.  Abdominal:     Palpations: Abdomen is soft.  Genitourinary:    General: Normal vulva.     Comments: Uterus: gravid, S=D, SE: cervix is smooth, pink, no lesions, copious amt of thick, white vaginal d/c with some adherence to vaginal walls -- fFN and WP done, cervix: closed/50%/soft, no CMT or friability, no adnexal tenderness  Skin:    General: Skin is warm and dry.  Neurological:     Mental Status: She is oriented to person, place, and time.  Psychiatric:        Mood and Affect: Mood normal.        Behavior: Behavior normal.        Thought Content: Thought content normal.        Judgment: Judgment normal.     MAU Course  Procedures  MDM ***  Assessment and Plan  ***  Raelyn Mora, CNM 01/28/2021, 11:57 PM

## 2021-01-28 NOTE — MAU Note (Signed)
PT SAYS UC STARTED ALL WEEK- WENT TO DR OFFICE ON Friday - GOT P- SHOT.  TONIGHT - INCREASED UC. Marland Kitchen NO VE IN OFFICE .

## 2021-01-29 LAB — WET PREP, GENITAL
Clue Cells Wet Prep HPF POC: NONE SEEN
Sperm: NONE SEEN
Trich, Wet Prep: NONE SEEN

## 2021-01-29 LAB — FETAL FIBRONECTIN: Fetal Fibronectin: NEGATIVE

## 2021-01-29 MED ORDER — FLUCONAZOLE 150 MG PO TABS: 150.0000 mg | ORAL_TABLET | ORAL | 1 refills | Status: DC

## 2021-01-29 MED ORDER — LACTATED RINGERS IV SOLN
INTRAVENOUS | Status: DC
  Administered 2021-01-29: 1000 mL via INTRAVENOUS

## 2021-01-29 NOTE — MAU Note (Signed)
Pt states contractions feel stronger and more regular since last Procardia dose. CNM notified

## 2021-01-29 NOTE — MAU Note (Signed)
Pt voiced concerns regarding discharge with continuing irregular contractions. Pt states that she feeling some contractions that are stronger than others. R.Dawson notified of pts concerns. 3rd dose of 10mg  procardia given that had been previously held. Liter of LR infused.

## 2021-02-06 ENCOUNTER — Encounter: Payer: Self-pay | Admitting: Physical Therapy

## 2021-02-13 ENCOUNTER — Encounter: Payer: Self-pay | Admitting: Physical Therapy

## 2021-03-01 LAB — OB RESULTS CONSOLE GBS: GBS: NEGATIVE

## 2021-03-06 ENCOUNTER — Telehealth (HOSPITAL_COMMUNITY): Payer: Self-pay | Admitting: *Deleted

## 2021-03-06 NOTE — Telephone Encounter (Signed)
Preadmission screen  

## 2021-03-12 ENCOUNTER — Telehealth (HOSPITAL_COMMUNITY): Payer: Self-pay | Admitting: *Deleted

## 2021-03-12 NOTE — Telephone Encounter (Signed)
Preadmission screen  

## 2021-03-14 ENCOUNTER — Telehealth (HOSPITAL_COMMUNITY): Payer: Self-pay | Admitting: *Deleted

## 2021-03-14 NOTE — Telephone Encounter (Signed)
Preadmission screen  

## 2021-03-15 ENCOUNTER — Telehealth (HOSPITAL_COMMUNITY): Payer: Self-pay | Admitting: *Deleted

## 2021-03-15 NOTE — Telephone Encounter (Signed)
Preadmission screen  

## 2021-03-16 ENCOUNTER — Telehealth (HOSPITAL_COMMUNITY): Payer: Self-pay | Admitting: *Deleted

## 2021-03-16 NOTE — Telephone Encounter (Signed)
Preadmission screen  

## 2021-03-17 ENCOUNTER — Other Ambulatory Visit (HOSPITAL_COMMUNITY)
Admission: RE | Admit: 2021-03-17 | Discharge: 2021-03-17 | Disposition: A | Discharge status: Home / Self Care | Payer: 59 | Source: Ambulatory Visit | Attending: Obstetrics and Gynecology | Admitting: Obstetrics and Gynecology

## 2021-03-17 DIAGNOSIS — Z20822 Contact with and (suspected) exposure to covid-19: Secondary | ICD-10-CM | POA: Insufficient documentation

## 2021-03-17 DIAGNOSIS — Z01812 Encounter for preprocedural laboratory examination: Secondary | ICD-10-CM | POA: Insufficient documentation

## 2021-03-17 LAB — SARS CORONAVIRUS 2 (TAT 6-24 HRS): SARS Coronavirus 2: NEGATIVE

## 2021-03-18 NOTE — H&P (Addendum)
OB History and Physical Preadmission H&P for 03/19/2021  Anna Brewer is a 35 y.o. female [phone] presenting for IOL at [redacted]w[redacted]d.  Her pregnancy history of significant for history of PTD with G1 at 32 weeks.  Her G2 was complicated by LGA and 9 lb delivery. She also has a history of uterine septum that was removed hysteroscopically.   Cervix most recently 1/50/-2. GBS negative, Rh positive.   OB History    Gravida  3   Para  2   Term  1   Preterm  1   AB  0   Living  2     SAB  0   IAB  0   Ectopic  0   Multiple  0   Live Births  2          Past Medical History:  Diagnosis Date  . GERD (gastroesophageal reflux disease)   . Migraine   . Mild intermittent asthma   . Wears glasses    Past Surgical History:  Procedure Laterality Date  . HYSTEROSCOPY N/A 08/27/2016   Procedure: HYSTEROSCOPY excision of uterine septum;  Surgeon: Fermin Schwab, MD;  Location: [facility];  Service: Gynecology;  Laterality: N/A;  . TONSILLECTOMY  2007  . WISDOM TOOTH EXTRACTION     Family History: family history includes Cancer in her maternal grandfather and paternal grandfather; Hypertension in her mother. Social History:  reports that she has never smoked. She has never used smokeless tobacco. She reports that she does not drink alcohol and does not use drugs.     Maternal Diabetes: No Genetic Screening: Normal Maternal Ultrasounds/Referrals: Normal Fetal Ultrasounds or other Referrals:  None Maternal Substance Abuse:  No Significant Maternal Medications:  None Significant Maternal Lab Results:  Group B Strep negative Other Comments:  None  Review of Systems History   unknown if currently breastfeeding. Exam Physical Exam   Addendum on day of admission: Gen: well appearing, no distress, on birthing ball Chest: nonlabored breathing Abdomen: gravid, nontender Ext: no signs of DVT  FHT cat 1  Prenatal labs: ABO, Rh: O/Positive/-- (08/31  0000) Antibody: Negative (08/31 0000) Rubella: Immune (08/31 0000) RPR: Nonreactive (08/31 0000)  HBsAg: Negative (08/31 0000)  HIV: Non-reactive (08/31 0000)  GBS:     Assessment/Plan: . Admit to Labor and Delivery . Cytotec for cervical ripening, followed by pitocin, AROM . Epidural when desired . Anticipate vaginal delivery   Lyn Henri 03/18/2021, 9:18 PM

## 2021-03-19 ENCOUNTER — Inpatient Hospital Stay (HOSPITAL_COMMUNITY): Payer: 59 | Admitting: Anesthesiology

## 2021-03-19 ENCOUNTER — Inpatient Hospital Stay (HOSPITAL_COMMUNITY): Payer: 59

## 2021-03-19 ENCOUNTER — Inpatient Hospital Stay (HOSPITAL_COMMUNITY)
Admission: AD | Admit: 2021-03-19 | Discharge: 2021-03-20 | DRG: 806 | Disposition: A | Discharge status: Home / Self Care | Payer: 59 | Attending: Obstetrics and Gynecology | Admitting: Obstetrics and Gynecology

## 2021-03-19 ENCOUNTER — Other Ambulatory Visit: Payer: Self-pay

## 2021-03-19 ENCOUNTER — Encounter (HOSPITAL_COMMUNITY): Payer: Self-pay | Admitting: Obstetrics and Gynecology

## 2021-03-19 DIAGNOSIS — O99013 Anemia complicating pregnancy, third trimester: Secondary | ICD-10-CM

## 2021-03-19 DIAGNOSIS — Z3A39 39 weeks gestation of pregnancy: Secondary | ICD-10-CM

## 2021-03-19 DIAGNOSIS — O3663X0 Maternal care for excessive fetal growth, third trimester, not applicable or unspecified: Secondary | ICD-10-CM | POA: Diagnosis present

## 2021-03-19 DIAGNOSIS — Z20822 Contact with and (suspected) exposure to covid-19: Secondary | ICD-10-CM | POA: Diagnosis present

## 2021-03-19 DIAGNOSIS — D62 Acute posthemorrhagic anemia: Secondary | ICD-10-CM | POA: Diagnosis not present

## 2021-03-19 DIAGNOSIS — O9081 Anemia of the puerperium: Secondary | ICD-10-CM | POA: Diagnosis not present

## 2021-03-19 DIAGNOSIS — Z3490 Encounter for supervision of normal pregnancy, unspecified, unspecified trimester: Secondary | ICD-10-CM

## 2021-03-19 DIAGNOSIS — O26893 Other specified pregnancy related conditions, third trimester: Secondary | ICD-10-CM | POA: Diagnosis present

## 2021-03-19 LAB — CBC
HCT: 31.7 % — ABNORMAL LOW (ref 36.0–46.0)
Hemoglobin: 9.6 g/dL — ABNORMAL LOW (ref 12.0–15.0)
MCH: 24.5 pg — ABNORMAL LOW (ref 26.0–34.0)
MCHC: 30.3 g/dL (ref 30.0–36.0)
MCV: 80.9 fL (ref 80.0–100.0)
Platelets: 194 10*3/uL (ref 150–400)
RBC: 3.92 MIL/uL (ref 3.87–5.11)
RDW: 15.9 % — ABNORMAL HIGH (ref 11.5–15.5)
WBC: 11.7 10*3/uL — ABNORMAL HIGH (ref 4.0–10.5)
nRBC: 0.2 % (ref 0.0–0.2)

## 2021-03-19 LAB — TYPE AND SCREEN
ABO/RH(D): O POS
Antibody Screen: NEGATIVE

## 2021-03-19 LAB — RPR: RPR Ser Ql: NONREACTIVE

## 2021-03-19 MED ORDER — EPHEDRINE 5 MG/ML INJ: 10.0000 mg | INTRAVENOUS | Status: DC | PRN

## 2021-03-19 MED ORDER — LACTATED RINGERS IV SOLN: 500.0000 mL | Freq: Once | INTRAVENOUS | Status: DC

## 2021-03-19 MED ORDER — TERBUTALINE SULFATE 1 MG/ML IJ SOLN: 0.2500 mg | Freq: Once | INTRAMUSCULAR | Status: DC | PRN

## 2021-03-19 MED ORDER — OXYCODONE-ACETAMINOPHEN 5-325 MG PO TABS: 2.0000 | ORAL_TABLET | ORAL | Status: DC | PRN

## 2021-03-19 MED ORDER — SENNOSIDES-DOCUSATE SODIUM 8.6-50 MG PO TABS: 2.0000 | ORAL_TABLET | ORAL | Status: DC

## 2021-03-19 MED ORDER — OXYTOCIN BOLUS FROM INFUSION
333.0000 mL | Freq: Once | INTRAVENOUS | Status: AC
  Administered 2021-03-19: 333 mL via INTRAVENOUS

## 2021-03-19 MED ORDER — ONDANSETRON HCL 4 MG/2ML IJ SOLN: 4.0000 mg | Freq: Four times a day (QID) | INTRAMUSCULAR | Status: DC | PRN

## 2021-03-19 MED ORDER — ONDANSETRON HCL 4 MG PO TABS: 4.0000 mg | ORAL_TABLET | ORAL | Status: DC | PRN

## 2021-03-19 MED ORDER — SOD CITRATE-CITRIC ACID 500-334 MG/5ML PO SOLN: 30.0000 mL | ORAL | Status: DC | PRN

## 2021-03-19 MED ORDER — ACETAMINOPHEN 325 MG PO TABS: 650.0000 mg | ORAL_TABLET | ORAL | Status: DC | PRN

## 2021-03-19 MED ORDER — OXYCODONE-ACETAMINOPHEN 5-325 MG PO TABS: 1.0000 | ORAL_TABLET | ORAL | Status: DC | PRN

## 2021-03-19 MED ORDER — DIPHENHYDRAMINE HCL 25 MG PO CAPS: 25.0000 mg | ORAL_CAPSULE | Freq: Four times a day (QID) | ORAL | Status: DC | PRN

## 2021-03-19 MED ORDER — SIMETHICONE 80 MG PO CHEW: 80.0000 mg | CHEWABLE_TABLET | ORAL | Status: DC | PRN

## 2021-03-19 MED ORDER — BUTORPHANOL TARTRATE 1 MG/ML IJ SOLN: 1.0000 mg | INTRAMUSCULAR | Status: DC | PRN

## 2021-03-19 MED ORDER — DOCUSATE SODIUM 100 MG PO CAPS
100.0000 mg | ORAL_CAPSULE | Freq: Every day | ORAL | Status: DC
  Administered 2021-03-20: 100 mg via ORAL
  Filled 2021-03-19: qty 1

## 2021-03-19 MED ORDER — LACTATED RINGERS IV SOLN: INTRAVENOUS | Status: DC

## 2021-03-19 MED ORDER — IBUPROFEN 600 MG PO TABS
600.0000 mg | ORAL_TABLET | Freq: Four times a day (QID) | ORAL | Status: DC
  Administered 2021-03-19 – 2021-03-20 (×4): 600 mg via ORAL
  Filled 2021-03-19 (×4): qty 1

## 2021-03-19 MED ORDER — LIDOCAINE HCL (PF) 1 % IJ SOLN: 30.0000 mL | INTRAMUSCULAR | Status: DC | PRN

## 2021-03-19 MED ORDER — WITCH HAZEL-GLYCERIN EX PADS: 1.0000 "application " | MEDICATED_PAD | CUTANEOUS | Status: DC | PRN

## 2021-03-19 MED ORDER — PRENATAL MULTIVITAMIN CH
1.0000 | ORAL_TABLET | Freq: Every day | ORAL | Status: DC
  Administered 2021-03-20: 1 via ORAL
  Filled 2021-03-19: qty 1

## 2021-03-19 MED ORDER — COCONUT OIL OIL: 1.0000 "application " | TOPICAL_OIL | Status: DC | PRN

## 2021-03-19 MED ORDER — LIDOCAINE-EPINEPHRINE 2 %-1:100000 IJ SOLN
INTRAMUSCULAR | Status: DC | PRN
  Administered 2021-03-19: 4 mL

## 2021-03-19 MED ORDER — ONDANSETRON HCL 4 MG/2ML IJ SOLN: 4.0000 mg | INTRAMUSCULAR | Status: DC | PRN

## 2021-03-19 MED ORDER — BENZOCAINE-MENTHOL 20-0.5 % EX AERO
1.0000 "application " | INHALATION_SPRAY | CUTANEOUS | Status: DC | PRN
  Administered 2021-03-19: 1 via TOPICAL
  Filled 2021-03-19: qty 56

## 2021-03-19 MED ORDER — DIBUCAINE (PERIANAL) 1 % EX OINT: 1.0000 "application " | TOPICAL_OINTMENT | CUTANEOUS | Status: DC | PRN

## 2021-03-19 MED ORDER — ZOLPIDEM TARTRATE 5 MG PO TABS: 5.0000 mg | ORAL_TABLET | Freq: Every evening | ORAL | Status: DC | PRN

## 2021-03-19 MED ORDER — MISOPROSTOL 25 MCG QUARTER TABLET
25.0000 ug | ORAL_TABLET | ORAL | Status: DC | PRN
  Administered 2021-03-19: 25 ug via VAGINAL
  Filled 2021-03-19 (×2): qty 1

## 2021-03-19 MED ORDER — PHENYLEPHRINE 40 MCG/ML (10ML) SYRINGE FOR IV PUSH (FOR BLOOD PRESSURE SUPPORT): 80.0000 ug | PREFILLED_SYRINGE | INTRAVENOUS | Status: DC | PRN

## 2021-03-19 MED ORDER — HYDROXYZINE HCL 50 MG PO TABS: 50.0000 mg | ORAL_TABLET | Freq: Four times a day (QID) | ORAL | Status: DC | PRN

## 2021-03-19 MED ORDER — DIPHENHYDRAMINE HCL 50 MG/ML IJ SOLN
12.5000 mg | INTRAMUSCULAR | Status: DC | PRN
Start: 2021-03-19 — End: 2021-03-19

## 2021-03-19 MED ORDER — OXYTOCIN-SODIUM CHLORIDE 30-0.9 UT/500ML-% IV SOLN
2.5000 [IU]/h | INTRAVENOUS | Status: DC
  Administered 2021-03-19: 2.5 [IU]/h via INTRAVENOUS

## 2021-03-19 MED ORDER — TETANUS-DIPHTH-ACELL PERTUSSIS 5-2.5-18.5 LF-MCG/0.5 IM SUSY: 0.5000 mL | PREFILLED_SYRINGE | Freq: Once | INTRAMUSCULAR | Status: DC

## 2021-03-19 MED ORDER — BUPIVACAINE HCL (PF) 0.25 % IJ SOLN
INTRAMUSCULAR | Status: DC | PRN
  Administered 2021-03-19: 6 mL via EPIDURAL

## 2021-03-19 MED ORDER — LACTATED RINGERS IV SOLN
500.0000 mL | INTRAVENOUS | Status: DC | PRN
  Administered 2021-03-19: 1000 mL via INTRAVENOUS

## 2021-03-19 MED ORDER — SODIUM BICARBONATE 8.4 % IV SOLN: INTRAVENOUS | Status: DC | PRN

## 2021-03-19 MED ORDER — OXYTOCIN-SODIUM CHLORIDE 30-0.9 UT/500ML-% IV SOLN
1.0000 m[IU]/min | INTRAVENOUS | Status: DC
  Administered 2021-03-19: 2 m[IU]/min via INTRAVENOUS
  Filled 2021-03-19: qty 500

## 2021-03-19 MED ORDER — FENTANYL-BUPIVACAINE-NACL 0.5-0.125-0.9 MG/250ML-% EP SOLN
12.0000 mL/h | EPIDURAL | Status: DC | PRN
  Administered 2021-03-19: 12 mL/h via EPIDURAL
  Filled 2021-03-19: qty 250

## 2021-03-19 NOTE — Anesthesia Procedure Notes (Signed)
Epidural Patient location during procedure: OB Start time: 03/19/2021 11:30 AM End time: 03/19/2021 11:34 AM  Staffing Anesthesiologist: Atilano Median, DO Performed: anesthesiologist   Preanesthetic Checklist Completed: patient identified, IV checked, site marked, risks and benefits discussed, surgical consent, monitors and equipment checked, pre-op evaluation and timeout performed  Epidural Patient position: sitting Prep: ChloraPrep Patient monitoring: heart rate, continuous pulse ox and blood pressure Approach: midline Location: L3-L4 Injection technique: LOR saline  Needle:  Needle type: Tuohy  Needle gauge: 17 G Needle length: 9 cm Needle insertion depth: 7 cm Catheter type: closed end flexible Catheter size: 20 Guage Catheter at skin depth: 12 cm Test dose: negative and 1.5% lidocaine  Assessment Events: blood not aspirated, injection not painful, no injection resistance and no paresthesia  Additional Notes Patient identified. Risks/Benefits/Options discussed with patient including but not limited to bleeding, infection, nerve damage, paralysis, failed block, incomplete pain control, headache, blood pressure changes, nausea, vomiting, reactions to medications, itching and postpartum back pain. Confirmed with bedside nurse the patient's most recent platelet count. Confirmed with patient that they are not currently taking any anticoagulation, have any bleeding history or any family history of bleeding disorders. Patient expressed understanding and wished to proceed. All questions were answered. Sterile technique was used throughout the entire procedure. Please see nursing notes for vital signs. Test dose was given through epidural catheter and negative prior to continuing to dose epidural or start infusion. Warning signs of high block given to the patient including shortness of breath, tingling/numbness in hands, complete motor block, or any concerning symptoms with  instructions to call for help. Patient was given instructions on fall risk and not to get out of bed. All questions and concerns addressed with instructions to call with any issues or inadequate analgesia.    Reason for block:procedure for pain

## 2021-03-19 NOTE — Progress Notes (Signed)
Labor Progress Note  Patient doing well, now comfortable with epidural.  Cervix 3-4/70/-2, head well applied.  AROM performed in typical fashion with return of clear fluid. Some bloody show present.  FHT cat 1 with accels  Continue current management, anticipate vaginal delivery  Nilda Simmer MD

## 2021-03-19 NOTE — Progress Notes (Signed)
Labor Progress Note  Patient doing well overnight, contractions increasing following cytotec.  Cervix 2-3/60/-2 at last check. Transitioned to pitocin, now currently on 6 mU/min with q 2-3 min contractions.  Patient on birthing ball in room, well appearing. Will defer cervical check at this time.  Discussed plan and will AROM following epidural placement, which she will obtain when ready.  FHT cat I  Continue current management, anticipate vaginal delivery  Nilda Simmer MD

## 2021-03-19 NOTE — Anesthesia Preprocedure Evaluation (Signed)
Anesthesia Evaluation  Patient identified by MRN, date of birth, ID band Patient awake    Reviewed: Allergy & Precautions, NPO status , Patient's Chart, lab work & pertinent test results  Airway Mallampati: II  TM Distance: >3 FB Neck ROM: Full    Dental  (+) Teeth Intact   Pulmonary asthma ,    Pulmonary exam normal        Cardiovascular negative cardio ROS   Rhythm:Regular Rate:Normal     Neuro/Psych  Headaches, negative psych ROS   GI/Hepatic Neg liver ROS, GERD  ,  Endo/Other  negative endocrine ROS  Renal/GU negative Renal ROS  negative genitourinary   Musculoskeletal negative musculoskeletal ROS (+)   Abdominal (+)  Abdomen: soft. Bowel sounds: normal.  Peds  Hematology negative hematology ROS (+)   Anesthesia Other Findings   Reproductive/Obstetrics (+) Pregnancy                             Anesthesia Physical Anesthesia Plan  ASA: II  Anesthesia Plan: Epidural   Post-op Pain Management:    Induction:   PONV Risk Score and Plan: 2 and Treatment may vary due to age or medical condition  Airway Management Planned: Natural Airway  Additional Equipment: None  Intra-op Plan:   Post-operative Plan:   Informed Consent: I have reviewed the patients History and Physical, chart, labs and discussed the procedure including the risks, benefits and alternatives for the proposed anesthesia with the patient or authorized representative who has indicated his/her understanding and acceptance.     Dental advisory given  Plan Discussed with:   Anesthesia Plan Comments: (Lab Results      Component                Value               Date                      WBC                      11.7 (H)            03/19/2021                HGB                      9.6 (L)             03/19/2021                HCT                      31.7 (L)            03/19/2021                MCV                       80.9                03/19/2021                PLT                      194                 03/19/2021          )  Anesthesia Quick Evaluation  

## 2021-03-19 NOTE — Lactation Note (Signed)
This note was copied from a baby's chart. Lactation Consultation Note  Patient Name: Anna Brewer [id number] Date: 03/19/2021 Reason for consult: L&D Initial assessment;Mother's request;Term Age:35 hours Infant a little sleepy with mucous secretion in his mouth. LC used bulb syringe to remove clear fluid. LC did suck training to help him coordinate his suck. LC able to feel tongue pass the gum line.  Infant latched prone had some trouble maintaining position. LC switched him to cross cradle with Mom providing head support. Latch at first pinching. LC demonstrated how to safely de latch infant. We latched him again ensuring nose and cheeks touching breast, lower lip flanged out and Dad offering compression of the breast.  Mom stated pain went away with changes made above.  Mom to receive further LC support on the floor.  Maternal Data Has patient been taught Hand Expression?: Yes Does the patient have breastfeeding experience prior to this delivery?: Yes How long did the patient breastfeed?: 15 and 18 months  Feeding Mother's Current Feeding Choice: Breast Milk  LATCH Score Latch: Repeated attempts needed to sustain latch, nipple held in mouth throughout feeding, stimulation needed to elicit sucking reflex.  Audible Swallowing: Spontaneous and intermittent  Type of Nipple: Everted at rest and after stimulation  Comfort (Breast/Nipple): Filling, red/small blisters or bruises, mild/mod discomfort  Hold (Positioning): Assistance needed to correctly position infant at breast and maintain latch.  LATCH Score: 7   Lactation Tools Discussed/Used    Interventions Interventions: Breast feeding basics reviewed;Breast compression;Assisted with latch;Adjust position;Skin to skin;Breast massage;Hand express;Expressed milk;Education  Discharge WIC Program: No  Consult Status Consult Status: Follow-up Date: 03/20/21 Follow-up type: In-patient    Anna Napier  Brewer 03/19/2021,  4:17 PM

## 2021-03-19 NOTE — Lactation Note (Signed)
This note was copied from a baby's chart. Lactation Consultation Note Attempted to see mom. Mom sleeping. Showed FOB Lactation badge.  Will try again later if mom doesn't call out when she wakes up.  Patient Name: Anna Brewer [id number] Date: 03/19/2021   Age:35 hours  Maternal Data    Feeding    LATCH Score                    Lactation Tools Discussed/Used    Interventions    Discharge    Consult Status      Charyl Dancer 03/19/2021, 9:27 PM

## 2021-03-20 LAB — CBC
HCT: 26.1 % — ABNORMAL LOW (ref 36.0–46.0)
Hemoglobin: 7.7 g/dL — ABNORMAL LOW (ref 12.0–15.0)
MCH: 23.9 pg — ABNORMAL LOW (ref 26.0–34.0)
MCHC: 29.5 g/dL — ABNORMAL LOW (ref 30.0–36.0)
MCV: 81.1 fL (ref 80.0–100.0)
Platelets: 159 10*3/uL (ref 150–400)
RBC: 3.22 MIL/uL — ABNORMAL LOW (ref 3.87–5.11)
RDW: 16.1 % — ABNORMAL HIGH (ref 11.5–15.5)
WBC: 13.7 10*3/uL — ABNORMAL HIGH (ref 4.0–10.5)
nRBC: 0 % (ref 0.0–0.2)

## 2021-03-20 NOTE — Lactation Note (Signed)
This note was copied from a baby's chart. Lactation Consultation Note Baby 12 hrs old at time of consult. Mom sleeping when LC came into room but woke up and looked at [facility]. LC introduced self. Asked FOB what time did the baby last feed, FOB stated 0100. LC suggested attempting to latch since LC here and it has been 3 hrs. Mom agreed. Mom stated she didn't feel like the baby got a deep latch last feeding. Mom asked about other positions than cradle and cross cradle. LC suggested football. Positioned w/pillows and baby into football. Baby latched well needing chin tug and lip flanged. Mom stated her nipples are sore. After flanging, mom stated better.  Newborn behavior, feeding habits, STS, I&O, safety while feeding, breast massage, supply and demand reviewed. Mom encouraged to feed baby 8-12 times/24 hours and with feeding cues.   Mom BF her 1st child that was in NICU for 15 months. Lots of challenges using NS, pumping, soreness. BF her 2nd child 18 months had a much better experience. Mom hopes to BF this baby well.  Encouraged mom to call for assistance or questions. Lactation brochure given.  Patient Name: Anna Brewer [id number] Date: 03/20/2021 Reason for consult: Initial assessment;Term Age:35 hours  Maternal Data Has patient been taught Hand Expression?: Yes Does the patient have breastfeeding experience prior to this delivery?: Yes How long did the patient breastfeed?: 15 months, 18 months  Feeding    LATCH Score Latch: Repeated attempts needed to sustain latch, nipple held in mouth throughout feeding, stimulation needed to elicit sucking reflex.  Audible Swallowing: None  Type of Nipple: Everted at rest and after stimulation (short shaft)  Comfort (Breast/Nipple): Soft / non-tender  Hold (Positioning): No assistance needed to correctly position infant at breast.  LATCH Score: 7   Lactation Tools Discussed/Used    Interventions Interventions: Breast feeding  basics reviewed;Support pillows;Assisted with latch;Position options;Skin to skin;Breast massage;Adjust position;Breast compression  Discharge WIC Program: No  Consult Status Consult Status: Follow-up Date: 03/21/21 Follow-up type: In-patient    Charyl Dancer 03/20/2021, 4:25 AM

## 2021-03-20 NOTE — Discharge Summary (Signed)
Postpartum Discharge Summary  Date of Service updated 03/20/21     Patient Name: Anna Brewer DOB: 06-Sep-1986 MRN: [id number]  Date of admission: 03/19/2021 Delivery date:03/19/2021  Delivering provider: Eyvonne Mechanic A  Date of discharge: 03/20/2021  Admitting diagnosis: Pregnancy [Z34.90] Intrauterine pregnancy: [redacted]w[redacted]d    Secondary diagnosis:  Active Problems:   Pregnancy  Additional problems: anemia, chronic and blood loss    Discharge diagnosis: Term Pregnancy Delivered and Anemia                                              Post partum procedures:none Augmentation: AROM, Pitocin and Cytotec Complications: None  Hospital course: Induction of Labor With Vaginal Delivery   35y.o. yo [phone] [id number] admitted to the hospital 03/19/2021 for induction of labor.  Indication for induction: Elective.  Patient had an uncomplicated labor course as follows: Membrane Rupture Time/Date: 1:13 PM ,03/19/2021   Delivery Method:Vaginal, Spontaneous  Episiotomy: None  Lacerations:  2nd degree  Details of delivery can be found in separate delivery note.  Patient had a routine postpartum course. Patient is discharged home 03/20/21.  Newborn Data: Birth date:03/19/2021  Birth time:3:22 PM  Gender:Female  Living status:Living  Apgars:9 ,9  Weight:3997 g   Magnesium Sulfate received: No BMZ received: No Rhophylac:N/A MMR:N/A T-DaP:Given prenatally Flu: N/A Transfusion:No  Physical exam  Vitals:   03/19/21 1830 03/19/21 2033 03/19/21 2334 03/20/21 0427  BP: 104/75 102/68 109/70 111/70  Pulse: 85 90 98 99  Resp: 17  16   Temp: 98.3 F (36.8 C) 98.6 F (37 C) 98.2 F (36.8 C) 98.2 F (36.8 C)  TempSrc: Oral Oral Oral Oral  SpO2: 98% 98% 99% 99%  Weight:      Height:       General: alert Lochia: appropriate Uterine Fundus: firm Incision: N/a DVT Evaluation: No evidence of DVT seen on physical exam. Labs: Lab Results  Component Value Date   WBC 13.7 (H) 03/20/2021    HGB 7.7 (L) 03/20/2021   HCT 26.1 (L) 03/20/2021   MCV 81.1 03/20/2021   PLT 159 03/20/2021   CMP Latest Ref Rng & Units 04/26/2018  Glucose 65 - 99 mg/dL 86  BUN 6 - 20 mg/dL <5(L)  Creatinine 0.44 - 1.00 mg/dL 0.45  Sodium 135 - 145 mmol/L 135  Potassium 3.5 - 5.1 mmol/L 3.6  Chloride 101 - 111 mmol/L 105  CO2 22 - 32 mmol/L 20(L)  Calcium 8.9 - 10.3 mg/dL 8.3(L)  Total Protein 6.5 - 8.1 g/dL 6.7  Total Bilirubin 0.3 - 1.2 mg/dL 0.6  Alkaline Phos 38 - 126 U/L 90  AST 15 - 41 U/L 16  ALT 14 - 54 U/L 11(L)   Edinburgh Score: Edinburgh Postnatal Depression Scale Screening Tool 03/19/2021  I have been able to laugh and see the funny side of things. (No Data)  I have looked forward with enjoyment to things. -  I have blamed myself unnecessarily when things went wrong. -  I have been anxious or worried for no good reason. -  I have felt scared or panicky for no good reason. -  Things have been getting on top of me. -  I have been so unhappy that I have had difficulty sleeping. -  I have felt sad or miserable. -  I have been so unhappy that  I have been crying. -  The thought of harming myself has occurred to me. Flavia Shipper Postnatal Depression Scale Total -      After visit meds:  Allergies as of 03/20/2021   No Known Allergies     Medication List    STOP taking these medications   Comfort Fit Maternity Supp Sm Misc   cyclobenzaprine 5 MG tablet Commonly known as: FLEXERIL   fluconazole 150 MG tablet Commonly known as: DIFLUCAN   MAGNESIUM GLYCINATE PO   Makena autoinjector Generic drug: HYDROXYprogesterone caproate     TAKE these medications   docusate sodium 100 MG capsule Commonly known as: COLACE Take 100 mg by mouth daily.   prenatal multivitamin Tabs tablet Take 1 tablet by mouth daily at 12 noon.   PROBIOTIC DAILY PO Take 1 tablet by mouth daily.   VITAMIN D PO Take 5,000 Units by mouth at bedtime.      Continue iron for anemia from  which is  asymptomatic.  Declines inpatient circ.  Discharge home in stable condition Infant Feeding: Breast Infant Disposition:home with mother Discharge instruction: per After Visit Summary and Postpartum booklet. Activity: Advance as tolerated. Pelvic rest for 6 weeks.  Diet: routine diet Anticipated Birth Control: Unsure Postpartum Appointment:6 weeks Additional Postpartum F/U: n/a Future Appointments:No future appointments. Follow up Visit:      03/20/2021 Tyson Dense, MD

## 2021-03-20 NOTE — Lactation Note (Signed)
This note was copied from a baby's chart. Lactation Consultation Note  Patient Name: Boy Litha Lamartina [id number] Date: 03/20/2021   Age:35 hours  Baby boy Tax now 53 hours old. Infant fed really well past delivery but has been sleepy since.  A little spitty.  Mom is an experienced breastfeeding mom.  Assisted with some hand expression and spoon feeding. Mom able to hand express small drops easily.  Dad undressed him and mom latched him to left breast in cross cradle hold. Mom reports nipples are a little tender from trying and when he does latch he doesnt latch deeply or very long.   Assisted in a chin tug to help him flange his lip and get mouth open wide.  Urged mom to keep him in close to help keep mouth open, chin into breast and bottom lip flanged.   Left mom and baby breastfeeding.  Urged mom to call lactation as needed.

## 2021-03-20 NOTE — Anesthesia Postprocedure Evaluation (Signed)
Anesthesia Post Note  Patient: Anna Brewer  Procedure(s) Performed: AN AD HOC LABOR EPIDURAL     Patient location during evaluation: Mother Baby Anesthesia Type: Epidural Level of consciousness: awake and alert Pain management: pain level controlled Vital Signs Assessment: post-procedure vital signs reviewed and stable Respiratory status: spontaneous breathing, nonlabored ventilation and respiratory function stable Cardiovascular status: stable Postop Assessment: no headache, no backache and epidural receding Anesthetic complications: no   No complications documented.  Last Vitals:  Vitals:   03/19/21 2334 03/20/21 0427  BP: 109/70 111/70  Pulse: 98 99  Resp: 16   Temp: 36.8 C 36.8 C  SpO2: 99% 99%    Last Pain:  Vitals:   03/20/21 0427  TempSrc: Oral  PainSc: 2    Pain Goal:                   [employer]

## 2021-03-21 ENCOUNTER — Ambulatory Visit: Payer: Self-pay

## 2021-03-21 NOTE — Lactation Note (Signed)
This note was copied from a baby's chart. Lactation Consultation Note LC checked in on mom and baby. Mom sleeping. FOB holding baby.  Patient Name: Anna Brewer [id number] Date: 03/21/2021   Age:35 hours  Maternal Data    Feeding    LATCH Score                    Lactation Tools Discussed/Used Tools: Nipple [facility]  Interventions    Discharge    Consult Status      Anna Brewer 03/21/2021, 6:03 AM

## 2021-03-21 NOTE — Lactation Note (Addendum)
This note was copied from a baby's chart. Lactation Consultation Note Baby 21 hrs old. Mom called d/t nipple pain. Mom had blood blister to Rt. Nipple. Everted nipples are very sore. Mom wanted to use NS. Stated she had to use one in the past. #24 was to large. #20 even large but worked well. Mom stated it still hurt but not as bad.  Mom stated feels like he is clamping, biting. She stated she feels his gums verses his tongue.  LC assessed w/gloved finger. Baby can extend his tongue and cupped finger well. Mom stated she wasn't feeling that at all. Baby needs lips flanged frequently d/t baby tightens up. That's why LC tried # 24 NS but it is way to large.  Gave mom comfort gel to apply to nipple w/blister. Encouraged not to use coconut oil and comfort gels together.  Baby is slightly jaundice in appearance. Baby wanting to cluster feed. Mom stated she has been trying all day to feed him and he was very sleepy. Mom stated "it's going to be a long night".  LC took DEBP kit into room. Mom stated it will be hard to pump when the baby is cluster feeding. LC stated if she decides to pump the kit is here, call for staff to set it up. Mom stated OK.  Encouraged mom to call for assistance or questions.  Patient Name: Anna Brewer [id number] Date: 03/21/2021 Reason for consult: Mother's request;Nipple pain/trauma;Term Age:76 hours  Maternal Data    Feeding    LATCH Score Latch: Grasps breast easily, tongue down, lips flanged, rhythmical sucking.  Audible Swallowing: A few with stimulation  Type of Nipple: Everted at rest and after stimulation  Comfort (Breast/Nipple): Filling, red/small blisters or bruises, mild/mod discomfort  Hold (Positioning): Assistance needed to correctly position infant at breast and maintain latch.  LATCH Score: 7   Lactation Tools Discussed/Used Tools: Pump;Coconut oil;Comfort gels;Nipple Shields Nipple shield size: 20 Breast pump type: Double-Electric  Breast Pump (Not set up d/t baby cluster feeding.) Reason for Pumping: NS  Interventions Interventions: Support pillows;Assisted with latch;Position options;Skin to skin;Breast massage;Comfort gels  Discharge    Consult Status Consult Status: Follow-up Date: 03/21/21 Follow-up type: In-patient    Theodoro Kalata 03/21/2021, 12:37 AM
